# Patient Record
Sex: Female | Born: 1990
Health system: Southern US, Community
[De-identification: ages and names within clinical notes are randomized; demographics above are authoritative.]

## PROBLEM LIST (undated history)

## (undated) DIAGNOSIS — A159 Respiratory tuberculosis unspecified: Secondary | ICD-10-CM

## (undated) DIAGNOSIS — F419 Anxiety disorder, unspecified: Secondary | ICD-10-CM

## (undated) HISTORY — DX: Anxiety disorder, unspecified: F41.9

## (undated) HISTORY — DX: Respiratory tuberculosis unspecified: A15.9

## (undated) HISTORY — PX: OTHER SURGICAL HISTORY: SHX169

---

## 2016-03-06 DIAGNOSIS — H1012 Acute atopic conjunctivitis, left eye: Secondary | ICD-10-CM | POA: Diagnosis not present

## 2016-04-21 DIAGNOSIS — H1032 Unspecified acute conjunctivitis, left eye: Secondary | ICD-10-CM | POA: Diagnosis not present

## 2016-07-14 ENCOUNTER — Encounter (HOSPITAL_BASED_OUTPATIENT_CLINIC_OR_DEPARTMENT_OTHER): Payer: Self-pay | Admitting: *Deleted

## 2016-07-14 ENCOUNTER — Emergency Department (HOSPITAL_BASED_OUTPATIENT_CLINIC_OR_DEPARTMENT_OTHER)
Admission: EM | Admit: 2016-07-14 | Discharge: 2016-07-14 | Disposition: A | Discharge status: Home / Self Care | Payer: No Typology Code available for payment source | Attending: Emergency Medicine | Admitting: Emergency Medicine

## 2016-07-14 ENCOUNTER — Emergency Department (HOSPITAL_BASED_OUTPATIENT_CLINIC_OR_DEPARTMENT_OTHER): Payer: No Typology Code available for payment source

## 2016-07-14 DIAGNOSIS — S39012A Strain of muscle, fascia and tendon of lower back, initial encounter: Secondary | ICD-10-CM | POA: Insufficient documentation

## 2016-07-14 DIAGNOSIS — Z79899 Other long term (current) drug therapy: Secondary | ICD-10-CM | POA: Insufficient documentation

## 2016-07-14 DIAGNOSIS — Y999 Unspecified external cause status: Secondary | ICD-10-CM | POA: Diagnosis not present

## 2016-07-14 DIAGNOSIS — Y939 Activity, unspecified: Secondary | ICD-10-CM | POA: Diagnosis not present

## 2016-07-14 DIAGNOSIS — Y92410 Unspecified street and highway as the place of occurrence of the external cause: Secondary | ICD-10-CM | POA: Diagnosis not present

## 2016-07-14 DIAGNOSIS — S3992XA Unspecified injury of lower back, initial encounter: Secondary | ICD-10-CM | POA: Diagnosis present

## 2016-07-14 MED ORDER — IBUPROFEN 400 MG PO TABS
400.0000 mg | ORAL_TABLET | Freq: Once | ORAL | Status: AC
  Administered 2016-07-14: 400 mg via ORAL
  Filled 2016-07-14: qty 1
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
## 2016-07-14 NOTE — Discharge Instructions (Signed)
It was our pleasure to provide your ER care today - we hope that you feel better.  Take motrin or aleve as need for pain.  Follow up with primary care doctor in 1 week if symptoms fail to improve/resolve.  Return to ER if worse, new symptoms, severe pain, other concern.

## 2016-09-06 DIAGNOSIS — R112 Nausea with vomiting, unspecified: Secondary | ICD-10-CM | POA: Diagnosis not present

## 2016-09-06 DIAGNOSIS — R42 Dizziness and giddiness: Secondary | ICD-10-CM | POA: Diagnosis not present

## 2016-09-16 ENCOUNTER — Encounter: Payer: Self-pay | Admitting: Physician Assistant

## 2016-09-16 ENCOUNTER — Ambulatory Visit (INDEPENDENT_AMBULATORY_CARE_PROVIDER_SITE_OTHER): Payer: 59 | Admitting: Physician Assistant

## 2016-09-16 VITALS — BP 116/80 | HR 92 | Temp 98.5°F | Ht 65.0 in | Wt 139.2 lb

## 2016-09-16 DIAGNOSIS — H6983 Other specified disorders of Eustachian tube, bilateral: Secondary | ICD-10-CM | POA: Diagnosis not present

## 2016-09-16 DIAGNOSIS — H8113 Benign paroxysmal vertigo, bilateral: Secondary | ICD-10-CM | POA: Insufficient documentation

## 2016-09-16 DIAGNOSIS — R42 Dizziness and giddiness: Secondary | ICD-10-CM | POA: Insufficient documentation

## 2016-09-16 MED ORDER — FLUTICASONE PROPIONATE 50 MCG/ACT NA SUSP: 2.0000 | Freq: Every day | NASAL | 11 refills | Status: DC

## 2016-09-16 MED ORDER — LORATADINE 10 MG PO TABS: 10.0000 mg | ORAL_TABLET | Freq: Every day | ORAL | 11 refills | Status: DC

## 2016-09-16 NOTE — Patient Instructions (Signed)

## 2016-09-17 NOTE — Progress Notes (Signed)
BP 116/80   Pulse 92   Temp 98.5 F (36.9 C) (Oral)   Ht 5\' 5"  (1.651 m)   Wt 139 lb 3.2 oz (63.1 kg)   BMI 23.16 kg/m    Subjective:    Patient ID: Bethany Singleton, female    DOB: [birth year], 26 y.o.   MRN: [id number]  HPI: Bethany Rassabella M Singleton is a 26 y.o. female presenting on 09/16/2016 for Dizziness Micah Flesher(Went to ER on 09/06/16 and was dx with vertigo ) and Ears Popping  This is a new patient to our office. Several years ago I saw her [facility]. She been very healthy not having any difficulties. She reports that on July 1 she had an episode where she felt very sick and vomited for about 2 days and very lightheaded. It affected her vision during this time. She had a lot of dizziness and she felt like her head was inside out. She had another episode on 09/07/2016 where she had dizziness and went to the emergency room. She was provided to meclizine. No scans were performed. Throughout this time she has not had any fever or tinnitus. She had another episode this past week on 09/13/2016. She is concerned about the recurrence and chronicity of this.  Relevant past medical, surgical, family and social history reviewed and updated as indicated. Allergies and medications reviewed and updated.  History reviewed. No pertinent past medical history.  Past Surgical History:  Procedure Laterality Date  . lymph node removal from behind R ear      Review of Systems  Constitutional: Negative.  Negative for activity change, fatigue and fever.  HENT: Negative.   Eyes: Negative.   Respiratory: Negative.  Negative for cough.   Cardiovascular: Negative.  Negative for chest pain.  Gastrointestinal: Negative.  Negative for abdominal pain.  Endocrine: Negative.   Genitourinary: Negative.  Negative for dysuria.  Musculoskeletal: Negative.   Skin: Negative.   Neurological: Positive for dizziness and light-headedness. Negative for seizures, weakness, numbness and headaches.    Allergies  as of 09/16/2016      Reactions   Latex       Medication List       Accurate as of 09/16/16 11:59 PM. Always use your most recent med list.          fluticasone 50 MCG/ACT nasal spray Commonly known as:  FLONASE Place 2 sprays into both nostrils daily.   loratadine 10 MG tablet Commonly known as:  CLARITIN Take 1 tablet (10 mg total) by mouth daily.   meclizine 25 MG tablet Commonly known as:  ANTIVERT Take 25 mg by mouth 3 (three) times daily as needed for dizziness.   medroxyPROGESTERone 150 MG/ML injection Commonly known as:  DEPO-PROVERA Inject 150 mg into the muscle every 3 (three) months.          Objective:    BP 116/80   Pulse 92   Temp 98.5 F (36.9 C) (Oral)   Ht 5\' 5"  (1.651 m)   Wt 139 lb 3.2 oz (63.1 kg)   BMI 23.16 kg/m   Allergies  Allergen Reactions  . Latex     Physical Exam  Constitutional: She is oriented to person, place, and time. She appears well-developed and well-nourished.  HENT:  Head: Normocephalic and atraumatic.  Right Ear: Tympanic membrane, external ear and ear canal normal.  Left Ear: Tympanic membrane, external ear and ear canal normal.  Nose: Nose normal. No rhinorrhea.  Mouth/Throat: Oropharynx is clear and  moist and mucous membranes are normal. No oropharyngeal exudate or posterior oropharyngeal erythema.  Eyes: Pupils are equal, round, and reactive to light. Conjunctivae and EOM are normal.  Neck: Normal range of motion. Neck supple.  Cardiovascular: Normal rate, regular rhythm, normal heart sounds and intact distal pulses.   Pulmonary/Chest: Effort normal and breath sounds normal.  Abdominal: Soft. Bowel sounds are normal.  Neurological: She is alert and oriented to person, place, and time. She has normal reflexes. Coordination abnormal. Gait normal.  Positive RHOMBERG  Skin: Skin is warm and dry. No rash noted.  Psychiatric: She has a normal mood and affect. Her behavior is normal. Judgment and thought content  normal.    No results found for this or any previous visit.    Assessment & Plan:   1. Vertigo - meclizine (ANTIVERT) 25 MG tablet; Take 25 mg by mouth 3 (three) times daily as needed for dizziness.  2. Benign positional vertigo, bilateral Neurology referral  3. Dysfunction of both eustachian tubes - loratadine (CLARITIN) 10 MG tablet; Take 1 tablet (10 mg total) by mouth daily.  Dispense: 30 tablet; Refill: 11 - fluticasone (FLONASE) 50 MCG/ACT nasal spray; Place 2 sprays into both nostrils daily.  Dispense: 16 g; Refill: 11   Continue all other maintenance medications as listed above.  Follow up plan: Return if symptoms worsen or fail to improve.  Educational handout given for vertigo  Remus Loffler PA-C [facility] [address]  [place], [place] [address] [phone]   09/17/2016, 9:39 AM

## 2016-09-21 ENCOUNTER — Encounter: Payer: Self-pay | Admitting: Neurology

## 2016-11-21 DIAGNOSIS — W540XXA Bitten by dog, initial encounter: Secondary | ICD-10-CM | POA: Diagnosis not present

## 2016-11-21 DIAGNOSIS — S61211A Laceration without foreign body of left index finger without damage to nail, initial encounter: Secondary | ICD-10-CM | POA: Diagnosis not present

## 2016-11-21 DIAGNOSIS — S61251A Open bite of left index finger without damage to nail, initial encounter: Secondary | ICD-10-CM | POA: Diagnosis not present

## 2016-11-21 DIAGNOSIS — S61259A Open bite of unspecified finger without damage to nail, initial encounter: Secondary | ICD-10-CM | POA: Diagnosis not present

## 2016-12-15 ENCOUNTER — Ambulatory Visit: Payer: Self-pay | Admitting: Neurology

## 2017-01-04 ENCOUNTER — Ambulatory Visit: Payer: Self-pay | Admitting: Neurology

## 2017-02-24 DIAGNOSIS — Z01419 Encounter for gynecological examination (general) (routine) without abnormal findings: Secondary | ICD-10-CM | POA: Diagnosis not present

## 2017-03-12 ENCOUNTER — Encounter: Payer: Self-pay | Admitting: Physician Assistant

## 2017-03-12 ENCOUNTER — Ambulatory Visit (INDEPENDENT_AMBULATORY_CARE_PROVIDER_SITE_OTHER): Payer: 59 | Admitting: Physician Assistant

## 2017-03-12 VITALS — BP 126/76 | HR 94 | Temp 98.3°F | Ht 65.0 in | Wt 155.0 lb

## 2017-03-12 DIAGNOSIS — L0291 Cutaneous abscess, unspecified: Secondary | ICD-10-CM | POA: Diagnosis not present

## 2017-03-12 DIAGNOSIS — K5909 Other constipation: Secondary | ICD-10-CM | POA: Diagnosis not present

## 2017-03-12 MED ORDER — CLINDAMYCIN HCL 300 MG PO CAPS: 300.0000 mg | ORAL_CAPSULE | Freq: Three times a day (TID) | ORAL | 2 refills | Status: DC

## 2017-03-12 NOTE — Patient Instructions (Signed)
Your child needs to take Miralax, a powder that you mix in a clear liquid.   1. Stir the Miralax powder into water, juice, or Gatorade. Your child's Miralax dose is:   8 capfuls of Miralax powder in 32 to 64 ounces of liquid  2. Give your child 4 to 8 ounces to drink every 30 minutes. It will take 4 to 6 hours for your child to finish the medicine. 3. After the medicine is gone, have your child drink more water or juice. This will help with the cleanout.  After the clean out, your child will take a daily (maintenance) medicine for at least 6 months. 1 capful of powder in 8 ounces of liquid every day  Your child may have stomach pain or cramping during the clean out. This might mean your child has to go to the bathroom. Have your child sit on the toilet. Explain that the pain will go away when the stool is gone. You may want to read to your child while you wait. A warm bath may also help.  Your child should have almost clear liquid stools by the end of the next day.   

## 2017-03-15 DIAGNOSIS — K5909 Other constipation: Secondary | ICD-10-CM | POA: Insufficient documentation

## 2017-03-15 NOTE — Progress Notes (Signed)
BP 126/76   Pulse 94   Temp 98.3 F (36.8 C) (Oral)   Ht 5\' 5"  (1.651 m)   Wt 155 lb (70.3 kg)   BMI 25.79 kg/m    Subjective:    Patient ID: Bethany Singleton, female    DOB: [birth year], 27 y.o.   MRN: [id number]  HPI: Bethany Singleton is a 27 y.o. female presenting on 03/12/2017 for Recurrent Skin Infections and Abdominal Cramping  Patient has had long-term constipation.  At this point she is only having watery stools after she eats a fatty meal.  She has not had a cleanout in some time.  She had used Linzess and Amitiza in the past.  She currently is without medication coverage on her insurance.  She has reached a very high deductible before it covers anything.  She also has an abscess in the perirectal area.  She has had these 2 times before.  She denies any problems with hemorrhoids.  Relevant past medical, surgical, family and social history reviewed and updated as indicated. Allergies and medications reviewed and updated.  History reviewed. No pertinent past medical history.  Past Surgical History:  Procedure Laterality Date  . lymph node removal from behind R ear      Review of Systems  Constitutional: Negative.  Negative for activity change, fatigue and fever.  HENT: Negative.   Eyes: Negative.   Respiratory: Negative.  Negative for cough.   Cardiovascular: Negative.  Negative for chest pain.  Gastrointestinal: Positive for abdominal distention, abdominal pain, constipation and rectal pain. Negative for blood in stool.  Endocrine: Negative.   Genitourinary: Negative.  Negative for dysuria.  Musculoskeletal: Negative.   Skin: Positive for color change.  Neurological: Negative.     Allergies as of 03/12/2017      Reactions   Latex       Medication List        Accurate as of 03/12/17 11:59 PM. Always use your most recent med list.          clindamycin 300 MG capsule Commonly known as:  CLEOCIN Take 1 capsule (300 mg total) by mouth 3 (three) times daily.          Objective:    BP 126/76   Pulse 94   Temp 98.3 F (36.8 C) (Oral)   Ht 5\' 5"  (1.651 m)   Wt 155 lb (70.3 kg)   BMI 25.79 kg/m   Allergies  Allergen Reactions  . Latex     Physical Exam  Constitutional: She is oriented to person, place, and time. She appears well-developed and well-nourished.  HENT:  Head: Normocephalic and atraumatic.  Right Ear: Tympanic membrane, external ear and ear canal normal.  Left Ear: Tympanic membrane, external ear and ear canal normal.  Nose: Nose normal. No rhinorrhea.  Mouth/Throat: Oropharynx is clear and moist and mucous membranes are normal. No oropharyngeal exudate or posterior oropharyngeal erythema.  Eyes: Conjunctivae and EOM are normal. Pupils are equal, round, and reactive to light.  Neck: Normal range of motion. Neck supple.  Cardiovascular: Normal rate, regular rhythm, normal heart sounds and intact distal pulses.  Pulmonary/Chest: Effort normal and breath sounds normal.  Abdominal: Soft. Bowel sounds are normal. She exhibits no shifting dullness. There is tenderness in the left upper quadrant and left lower quadrant. There is no rebound and no CVA tenderness. No hernia.    Neurological: She is alert and oriented to person, place, and time. She has normal reflexes.  Skin: Skin  is warm and dry. No rash noted.  Abscess in perirectal area  Psychiatric: She has a normal mood and affect. Her behavior is normal. Judgment and thought content normal.    No results found for this or any previous visit.    Assessment & Plan:   1. Chronic constipation with overflow Start bowel cleanout with MiraLAX No insurance coverage for medications.  Had tried Linzess and Amitiza in the past.  2. Abscess - clindamycin (CLEOCIN) 300 MG capsule; Take 1 capsule (300 mg total) by mouth 3 (three) times daily.  Dispense: 30 capsule; Refill: 2    Current Outpatient Medications:  .  clindamycin (CLEOCIN) 300 MG capsule, Take 1 capsule (300 mg  total) by mouth 3 (three) times daily., Disp: 30 capsule, Rfl: 2 Continue all other maintenance medications as listed above.  Follow up plan: Return in about 4 weeks (around 04/09/2017) for recheck.  Educational handout given for bowel cleanout  Remus Loffler PA-C [facility] [address]  [place], [place] [address] [phone]   03/15/2017, 9:26 AM

## 2017-05-17 ENCOUNTER — Telehealth: Payer: Self-pay | Admitting: Physician Assistant

## 2017-05-18 NOTE — Telephone Encounter (Signed)
lmtcb jkp 3/19

## 2017-05-18 NOTE — Telephone Encounter (Signed)
Patient was seen by Prudy FeelerAngel Jones on 03/12/2017 and was supposed to follow up in one month for constipation and did not.  Appointment made this Friday, 05/21/17, with Prudy FeelerAngel Jones to discuss.

## 2017-05-21 ENCOUNTER — Ambulatory Visit: Payer: 59 | Admitting: Physician Assistant

## 2017-05-26 ENCOUNTER — Ambulatory Visit: Payer: 59 | Admitting: Physician Assistant

## 2017-07-05 ENCOUNTER — Encounter: Payer: Self-pay | Admitting: Physician Assistant

## 2017-07-06 ENCOUNTER — Other Ambulatory Visit: Payer: Self-pay | Admitting: Physician Assistant

## 2017-07-06 MED ORDER — CITALOPRAM HYDROBROMIDE 20 MG PO TABS: 20.0000 mg | ORAL_TABLET | Freq: Every day | ORAL | 1 refills | Status: DC

## 2017-07-21 ENCOUNTER — Other Ambulatory Visit: Payer: Self-pay | Admitting: Physician Assistant

## 2017-07-22 MED ORDER — CITALOPRAM HYDROBROMIDE 20 MG PO TABS
20.0000 mg | ORAL_TABLET | Freq: Every day | ORAL | 1 refills | Status: DC
Start: 2017-07-22 — End: 2018-10-28

## 2017-07-22 NOTE — Addendum Note (Signed)
Addended by: Julious Payer D on: 07/22/2017 10:52 AM   Modules accepted: Orders

## 2018-05-03 ENCOUNTER — Encounter: Payer: Self-pay | Admitting: Physician Assistant

## 2018-10-28 ENCOUNTER — Other Ambulatory Visit: Payer: Self-pay

## 2018-10-28 ENCOUNTER — Encounter: Payer: Self-pay | Admitting: Nurse Practitioner

## 2018-10-28 ENCOUNTER — Ambulatory Visit (INDEPENDENT_AMBULATORY_CARE_PROVIDER_SITE_OTHER): Payer: 59 | Admitting: Nurse Practitioner

## 2018-10-28 ENCOUNTER — Telehealth: Payer: Self-pay | Admitting: Physician Assistant

## 2018-10-28 VITALS — BP 117/77 | HR 96 | Temp 99.1°F | Ht 65.0 in | Wt 144.0 lb

## 2018-10-28 DIAGNOSIS — F411 Generalized anxiety disorder: Secondary | ICD-10-CM

## 2018-10-28 DIAGNOSIS — K219 Gastro-esophageal reflux disease without esophagitis: Secondary | ICD-10-CM

## 2018-10-28 MED ORDER — PANTOPRAZOLE SODIUM 40 MG PO TBEC: 40.0000 mg | DELAYED_RELEASE_TABLET | Freq: Every day | ORAL | 3 refills | Status: DC

## 2018-10-28 MED ORDER — CITALOPRAM HYDROBROMIDE 20 MG PO TABS: 20.0000 mg | ORAL_TABLET | Freq: Every day | ORAL | 1 refills | Status: DC

## 2018-10-28 NOTE — Progress Notes (Signed)
Subjective:    Patient ID: Bethany Singleton, female    DOB: [birth year], 28 y.o.   MRN: [id number]   Chief Complaint: Chest burns after eating (Prilosec is not helping) and Anxiety   HPI Patient comes in today with 2 complaints - chest pain- says it is a burning feeling in chest. Started 2 weeks ago after drinking to much tequila. She says that after she eats it is worse. Pain increases when laying down in bed. She has been taking tums and prilosec and is no better. - anxiety- she has been anxious for several months.she was rx celexa and she never filled meds because it said that was for depression and she is not depressed. She is very anxious, working full time, going to school and planning a wedding. GAD 7 : Generalized Anxiety Score 10/28/2018  Nervous, Anxious, on Edge 3  Control/stop worrying 3  Worry too much - different things 2  Trouble relaxing 2  Restless 1  Easily annoyed or irritable 2  Afraid - awful might happen 0  Total GAD 7 Score 13  Anxiety Difficulty Somewhat difficult      Review of Systems  Constitutional: Negative for activity change and appetite change.  HENT: Negative.   Eyes: Negative for pain.  Respiratory: Negative for shortness of breath.   Cardiovascular: Negative for chest pain, palpitations and leg swelling.  Gastrointestinal: Negative for abdominal pain.  Endocrine: Negative for polydipsia.  Genitourinary: Negative.   Skin: Negative for rash.  Neurological: Negative for dizziness, weakness and headaches.  Hematological: Does not bruise/bleed easily.  Psychiatric/Behavioral: Negative.   All other systems reviewed and are negative.      Objective:   Physical Exam Vitals signs and nursing note reviewed.  Constitutional:      General: She is not in acute distress.    Appearance: Normal appearance. She is well-developed.  HENT:     Head: Normocephalic.     Nose: Nose normal.  Eyes:     Pupils: Pupils are equal, round, and reactive to  light.  Neck:     Musculoskeletal: Normal range of motion and neck supple.     Vascular: No carotid bruit or JVD.  Cardiovascular:     Rate and Rhythm: Normal rate and regular rhythm.     Heart sounds: Normal heart sounds.  Pulmonary:     Effort: Pulmonary effort is normal. No respiratory distress.     Breath sounds: Normal breath sounds. No wheezing or rales.  Chest:     Chest wall: No tenderness.  Abdominal:     General: Bowel sounds are normal. There is no distension or abdominal bruit.     Palpations: Abdomen is soft. There is no hepatomegaly, splenomegaly, mass or pulsatile mass.     Tenderness: There is no abdominal tenderness.  Musculoskeletal: Normal range of motion.  Lymphadenopathy:     Cervical: No cervical adenopathy.  Skin:    General: Skin is warm and dry.  Neurological:     Mental Status: She is alert and oriented to person, place, and time.     Deep Tendon Reflexes: Reflexes are normal and symmetric.  Psychiatric:        Behavior: Behavior normal.        Thought Content: Thought content normal.        Judgment: Judgment normal.    BP 117/77   Pulse 96   Temp 99.1 F (37.3 C) (Oral)   Ht 5\' 5"  (1.651 m)   Wt 144  lb (65.3 kg)   BMI 23.96 kg/m        Assessment & Plan:  ITZEL MCKIBBIN comes in today with chief complaint of Chest burns after eating (Prilosec is not helping) and Anxiety   Diagnosis and orders addressed:  1. Gastroesophageal reflux disease without esophagitis Avoid spicy and fatty foods Bland diet for 2 weeks - pantoprazole (PROTONIX) 40 MG tablet; Take 1 tablet (40 mg total) by mouth daily.  Dispense: 30 tablet; Refill: 3  2. GAD (generalized anxiety disorder) Stress management - citalopram (CELEXA) 20 MG tablet; Take 1 tablet (20 mg total) by mouth daily.  Dispense: 90 tablet; Refill: 1   Follow up plan: prn   Mary-Margaret Hassell Done, FNP

## 2018-10-28 NOTE — Patient Instructions (Signed)

## 2018-10-28 NOTE — Telephone Encounter (Signed)
Pt apt made

## 2018-11-10 ENCOUNTER — Other Ambulatory Visit: Payer: Self-pay | Admitting: Physician Assistant

## 2018-11-10 DIAGNOSIS — K219 Gastro-esophageal reflux disease without esophagitis: Secondary | ICD-10-CM

## 2018-11-14 ENCOUNTER — Encounter: Payer: Self-pay | Admitting: Gastroenterology

## 2018-11-18 ENCOUNTER — Encounter: Payer: Self-pay | Admitting: Physician Assistant

## 2018-11-18 ENCOUNTER — Ambulatory Visit (INDEPENDENT_AMBULATORY_CARE_PROVIDER_SITE_OTHER): Payer: 59 | Admitting: Family Medicine

## 2018-11-18 ENCOUNTER — Encounter: Payer: Self-pay | Admitting: Family Medicine

## 2018-11-18 DIAGNOSIS — Z8742 Personal history of other diseases of the female genital tract: Secondary | ICD-10-CM | POA: Diagnosis not present

## 2018-11-18 DIAGNOSIS — L03211 Cellulitis of face: Secondary | ICD-10-CM | POA: Diagnosis not present

## 2018-11-18 MED ORDER — AMOXICILLIN-POT CLAVULANATE 875-125 MG PO TABS: 1.0000 | ORAL_TABLET | Freq: Two times a day (BID) | ORAL | 0 refills | Status: AC

## 2018-11-18 MED ORDER — FLUCONAZOLE 150 MG PO TABS: ORAL_TABLET | ORAL | 0 refills | Status: DC

## 2018-11-18 MED ORDER — SULFAMETHOXAZOLE-TRIMETHOPRIM 800-160 MG PO TABS: 1.0000 | ORAL_TABLET | Freq: Two times a day (BID) | ORAL | 0 refills | Status: AC

## 2018-11-18 NOTE — Progress Notes (Signed)
Virtual Visit via telephone Note Due to COVID-19 pandemic this visit was conducted virtually. This visit type was conducted due to national recommendations for restrictions regarding the COVID-19 Pandemic (e.g. social distancing, sheltering in place) in an effort to limit this patient's exposure and mitigate transmission in our community. All issues noted in this document were discussed and addressed.  A physical exam was not performed with this format.   I connected with Bethany Singleton on 11/18/18 at 1110 by telephone and verified that I am speaking with the correct person using two identifiers. Bethany Singleton is currently located at home and family is currently with them during visit. The provider, Monia Pouch, FNP is located in their office at time of visit.  I discussed the limitations, risks, security and privacy concerns of performing an evaluation and management service by telephone and the availability of in person appointments. I also discussed with the patient that there may be a patient responsible charge related to this service. The patient expressed understanding and agreed to proceed.  Subjective:  Patient ID: Bethany Singleton, female    DOB: 12/20/1990, 28 y.o.   MRN: [id number]  Chief Complaint:  Abscess   HPI: Bethany Singleton is a 28 y.o. female presenting on 11/18/2018 for Abscess   Pt states she has swelling and redness to her right lower jaw. Pt states she has a bump there and tried to squeeze it a few days ago. States she has since developed the redness and swelling. States she was not able to get much out of the bump. States she did have a temperature of 99.4 yesterday, none today. No eye involvement or visual changes. No reported dental pain or caries. No weakness, confusion, or fatigue. No trouble swallowing, throat swelling, or shortness of breath.   Abscess This is a new problem. The current episode started in the past 7 days. Associated symptoms include a fever.  Pertinent negatives include no abdominal pain, anorexia, arthralgias, change in bowel habit, chest pain, chills, congestion, coughing, diaphoresis, fatigue, headaches, joint swelling, myalgias, nausea, neck pain, numbness, rash, sore throat, swollen glands, urinary symptoms, vertigo, visual change, vomiting or weakness.     Relevant past medical, surgical, family, and social history reviewed and updated as indicated.  Allergies and medications reviewed and updated.   History reviewed. No pertinent past medical history.  Past Surgical History:  Procedure Laterality Date  . lymph node removal from behind R ear      Social History   Socioeconomic History  . Marital status: Single    Spouse name: Not on file  . Number of children: Not on file  . Years of education: Not on file  . Highest education level: Not on file  Occupational History  . Not on file  Social Needs  . Financial resource strain: Not on file  . Food insecurity    Worry: Not on file    Inability: Not on file  . Transportation needs    Medical: Not on file    Non-medical: Not on file  Tobacco Use  . Smoking status: Never Smoker  . Smokeless tobacco: Never Used  Substance and Sexual Activity  . Alcohol use: Yes    Comment: weekends  . Drug use: No  . Sexual activity: Yes    Birth control/protection: Injection  Lifestyle  . Physical activity    Days per week: Not on file    Minutes per session: Not on file  . Stress: Not on file  Relationships  . Social Musician on phone: Not on file    Gets together: Not on file    Attends religious service: Not on file    Active member of club or organization: Not on file    Attends meetings of clubs or organizations: Not on file    Relationship status: Not on file  . Intimate partner violence    Fear of current or ex partner: Not on file    Emotionally abused: Not on file    Physically abused: Not on file    Forced sexual activity: Not on file  Other  Topics Concern  . Not on file  Social History Narrative  . Not on file    Outpatient Encounter Medications as of 11/18/2018  Medication Sig  . amoxicillin-clavulanate (AUGMENTIN) 875-125 MG tablet Take 1 tablet by mouth 2 (two) times daily for 7 days.  . citalopram (CELEXA) 20 MG tablet Take 1 tablet (20 mg total) by mouth daily.  . fluconazole (DIFLUCAN) 150 MG tablet 1 po q week x 4 weeks  . pantoprazole (PROTONIX) 40 MG tablet Take 1 tablet (40 mg total) by mouth daily.  Marland Kitchen sulfamethoxazole-trimethoprim (BACTRIM DS) 800-160 MG tablet Take 1 tablet by mouth 2 (two) times daily for 7 days.   No facility-administered encounter medications on file as of 11/18/2018.     Allergies  Allergen Reactions  . Latex     Review of Systems  Constitutional: Positive for fever. Negative for activity change, appetite change, chills, diaphoresis, fatigue and unexpected weight change.  HENT: Negative.  Negative for congestion, sore throat, trouble swallowing and voice change.   Eyes: Negative.  Negative for photophobia, pain, discharge, redness, itching and visual disturbance.  Respiratory: Negative for cough, choking, chest tightness, shortness of breath and stridor.   Cardiovascular: Negative for chest pain, palpitations and leg swelling.  Gastrointestinal: Negative for abdominal pain, anorexia, blood in stool, change in bowel habit, constipation, diarrhea, nausea and vomiting.  Endocrine: Negative.   Genitourinary: Negative for dysuria, frequency and urgency.  Musculoskeletal: Negative for arthralgias, joint swelling, myalgias and neck pain.  Skin: Positive for color change and wound. Negative for pallor and rash.  Allergic/Immunologic: Negative.   Neurological: Negative for dizziness, vertigo, weakness, numbness and headaches.  Hematological: Negative.   Psychiatric/Behavioral: Negative for confusion, hallucinations, sleep disturbance and suicidal ideas.  All other systems reviewed and are  negative.        Observations/Objective: No vital signs or physical exam, this was a telephone or virtual health encounter. Did review below images pt provided via MyChart.  Pt alert and oriented, answers all questions appropriately, and able to speak in full sentences.        Assessment and Plan: Rollene was seen today for abscess.  Diagnoses and all orders for this visit:  Cellulitis of face Cellulitis of face. Swelling and redness occurred after trying to squeeze a pimple. Slight low grade fever yesterday, none today. Will cover with below. Pt aware of symptoms that require emergent evaluation. Report any new, worsening, or persistent symptoms.  -     sulfamethoxazole-trimethoprim (BACTRIM DS) 800-160 MG tablet; Take 1 tablet by mouth 2 (two) times daily for 7 days. -     amoxicillin-clavulanate (AUGMENTIN) 875-125 MG tablet; Take 1 tablet by mouth 2 (two) times daily for 7 days.  History of vaginitis -     fluconazole (DIFLUCAN) 150 MG tablet; 1 po q week x 4 weeks     Follow Up  Instructions: Return in about 1 week (around 11/25/2018), or if symptoms worsen or fail to improve, for facial cellulitis .    I discussed the assessment and treatment plan with the patient. The patient was provided an opportunity to ask questions and all were answered. The patient agreed with the plan and demonstrated an understanding of the instructions.   The patient was advised to call back or seek an in-person evaluation if the symptoms worsen or if the condition fails to improve as anticipated.  The above assessment and management plan was discussed with the patient. The patient verbalized understanding of and has agreed to the management plan. Patient is aware to call the clinic if they develop any new symptoms or if symptoms persist or worsen. Patient is aware when to return to the clinic for a follow-up visit. Patient educated on when it is appropriate to go to the emergency department.     I provided 15 minutes of non-face-to-face time during this encounter. The call started at 1110. The call ended at [address]. The other time was used for coordination of care.    Kari BaarsMichelle Tenisha Fleece, FNP-C [facility] [address] [place], [place] [id number] [phone] 11/18/18

## 2018-12-01 ENCOUNTER — Ambulatory Visit: Payer: 59 | Admitting: Nurse Practitioner

## 2018-12-06 NOTE — Progress Notes (Addendum)
REVIEWED-NO ADDITIONAL RECOMMENDATIONS.  Referring Provider: Remus Loffler, PA-C Primary Care Physician:  Remus Loffler, PA-C Primary Gastroenterologist:  Dr. Darrick Penna  Chief Complaint  Patient presents with  . Gastroesophageal Reflux    HPI:   Bethany Singleton is a 28 y.o. female presenting today at the request of Remus Loffler, PA-C for GERD.   Reviewed recent PCP note from 10/28/2018.  Patient complained of burning in her chest x2 weeks after drinking too much tequila.  Worse after meals and with lying down.  Taking Tums and Prilosec without significant improvement.  Advised to follow bland diet for 2 weeks and start Protonix 40 mg daily.  Patient messages on 11/09/2018 with patient stating Protonix was not providing significant improvement.  Subsequently patient was referred to GI.   Today she states she has burning in her chest that started around the end of August. Was on Keto diet prior and after stopping the diet, she developed the burning in her chest.  Eating fried, fatty, greasy foods. Spaghetti, tacos, steak, fries.  Burning is worse with spicy foods. Drinks coffee daily. No soda. No large meals at dinner.   Taking Protonix 40 mg daily without significant improvement. Tried additional antiacid that doesn't help. Tums do not help. Chewing gum will help. Doesn't matter if she eats or doesn't. Burning is present all day. States she has a lot of anxiety. Was started on Celexa to see if this helped with burning. Symptoms are worse when waking in the morning. Taking Protonix after breakfast.    No dysphagia. No acid reflux. Occasional upper abdominal cramping. Feels like a gas cramp. States she is constipated all the time. Feels cramping is associated with constipation. No nausea or vomiting.   Reports she has long history of chronic constipation. BMs about once a week. Stools are small. Comes out in pieces. Straining at times. Mostly hard stools. Tried MiraLAX in the past which did not  help.  Linzess was too expensive. Takes probiotic.  Will have bright red blood per rectum with associated constipation. Occurs once a month to once every other month. Present for a few years. Has hemorrhoids that prolapse out with BMs. Not always bleeding. Will have burning at times. No sharp pain.   Rare ibuprofen use.   Past Medical History:  Diagnosis Date  . Anxiety     Past Surgical History:  Procedure Laterality Date  . lymph node removal from behind R ear      Current Outpatient Medications  Medication Sig Dispense Refill  . citalopram (CELEXA) 20 MG tablet Take 1 tablet (20 mg total) by mouth daily. 90 tablet 1  . lansoprazole (PREVACID) 30 MG capsule Take 1 capsule (30 mg total) by mouth daily at 12 noon. 90 capsule 3  . lubiprostone (AMITIZA) 8 MCG capsule Take 1 capsule (8 mcg total) by mouth 2 (two) times daily with a meal. 60 capsule 3   No current facility-administered medications for this visit.     Allergies as of 12/07/2018 - Review Complete 12/07/2018  Allergen Reaction Noted  . Latex      Family History  Problem Relation Age of Onset  . Colon cancer Neg Hx   . Colon polyps Neg Hx   . Inflammatory bowel disease Neg Hx     Social History   Socioeconomic History  . Marital status: Single    Spouse name: Not on file  . Number of children: Not on file  . Years of education: Not  on file  . Highest education level: Not on file  Occupational History  . Not on file  Social Needs  . Financial resource strain: Not on file  . Food insecurity    Worry: Not on file    Inability: Not on file  . Transportation needs    Medical: Not on file    Non-medical: Not on file  Tobacco Use  . Smoking status: Never Smoker  . Smokeless tobacco: Never Used  Substance and Sexual Activity  . Alcohol use: Yes    Comment: weekends- glass or two of wine  . Drug use: No  . Sexual activity: Yes    Birth control/protection: Injection  Lifestyle  . Physical activity     Days per week: Not on file    Minutes per session: Not on file  . Stress: Not on file  Relationships  . Social Herbalist on phone: Not on file    Gets together: Not on file    Attends religious service: Not on file    Active member of club or organization: Not on file    Attends meetings of clubs or organizations: Not on file    Relationship status: Not on file  . Intimate partner violence    Fear of current or ex partner: Not on file    Emotionally abused: Not on file    Physically abused: Not on file    Forced sexual activity: Not on file  Other Topics Concern  . Not on file  Social History Narrative  . Not on file    Review of Systems: Gen: Denies any fever, chills, fatigue, lightheadedness, dizziness, unintentional weight loss CV: Denies chest pain, heart palpitations Resp: Denies shortness of breath or cough.  GI: See HPI GU : Denies urinary burning, urinary frequency, urinary hesitancy MS: Denies joint pain, muscle weakness Derm: Denies rash Psych: Admits to anxiety.  Heme: Denies bruising.  Physical Exam: BP 115/81   Pulse 89   Temp (!) 96.8 F (36 C) (Temporal)   Ht 5\' 4"  (1.626 m)   Wt 143 lb 3.2 oz (65 kg)   LMP 11/14/2018 (Approximate)   BMI 24.58 kg/m  General:   Alert and oriented. Pleasant and cooperative. Well-nourished and well-developed.  Head:  Normocephalic and atraumatic. Eyes:  Without icterus, sclera clear and conjunctiva pink.  Ears:  Normal auditory acuity. Nose:  No deformity, discharge,  or lesions. Lungs:  Clear to auscultation bilaterally. No wheezes, rales, or rhonchi. No distress.  Heart:  S1, S2 present without murmurs appreciated.  Abdomen:  +BS, soft, non-tender and non-distended. No HSM noted. No guarding or rebound. No masses appreciated.  Rectal:  Deferred  Msk:  Symmetrical without gross deformities. Normal posture. Extremities:  Without edema. Neurologic:  Alert and  oriented x4;  grossly normal neurologically.  Skin:  Intact without significant lesions or rashes. Psych: Normal mood and affect.

## 2018-12-07 ENCOUNTER — Ambulatory Visit (INDEPENDENT_AMBULATORY_CARE_PROVIDER_SITE_OTHER): Payer: 59 | Admitting: Gastroenterology

## 2018-12-07 ENCOUNTER — Encounter: Payer: Self-pay | Admitting: Gastroenterology

## 2018-12-07 ENCOUNTER — Other Ambulatory Visit: Payer: Self-pay

## 2018-12-07 ENCOUNTER — Encounter: Payer: Self-pay | Admitting: *Deleted

## 2018-12-07 DIAGNOSIS — K625 Hemorrhage of anus and rectum: Secondary | ICD-10-CM | POA: Insufficient documentation

## 2018-12-07 DIAGNOSIS — K5909 Other constipation: Secondary | ICD-10-CM | POA: Insufficient documentation

## 2018-12-07 DIAGNOSIS — K649 Unspecified hemorrhoids: Secondary | ICD-10-CM | POA: Insufficient documentation

## 2018-12-07 DIAGNOSIS — R12 Heartburn: Secondary | ICD-10-CM | POA: Diagnosis not present

## 2018-12-07 MED ORDER — LANSOPRAZOLE 30 MG PO CPDR: 30.0000 mg | DELAYED_RELEASE_CAPSULE | Freq: Every day | ORAL | 3 refills | Status: DC

## 2018-12-07 MED ORDER — LUBIPROSTONE 8 MCG PO CAPS: 8.0000 ug | ORAL_CAPSULE | Freq: Two times a day (BID) | ORAL | 3 refills | Status: DC

## 2018-12-07 NOTE — Patient Instructions (Addendum)
We will get you scheduled for colonoscopy with possible hemorrhoid banding in the near future with Dr. Darrick Penna.   I will call [employer] and have them compound a cream for you to use for you hemorrhoids. They will call you when it is ready. You can use 3-4 times daily x 7 days then as needed for flares of hemorrhoids.   Please add metamucil or benefiber daily.   I will send in Amitiza 8 mcg for you to take twice daily with meals for constipation.   Please stop Protonix and start Prevacid 30 mg daily. Take this 30 minutes before breakfast.   Please follow strict GERD diet. Avoid fried, fatty, spicy, and citrus foods. Avoid caffeine and carbonated beverages. See handout below.   You should several small meals a day rather than 3 large meals. Ensure your largest meal is not in the evening. Do not eat within 3 hours of laying down. Prop the head of your bed up on wood or bricks to create an incline.   We will see you back in 4 weeks. Call if questions or concerns prior.   Ermalinda Memos, PA-C [facility]   Food Choices for Gastroesophageal Reflux Disease, Adult When you have gastroesophageal reflux disease (GERD), the foods you eat and your eating habits are very important. Choosing the right foods can help ease your discomfort. Think about working with a nutrition specialist (dietitian) to help you make good choices. What are tips for following this plan?  Meals  Choose healthy foods that are low in fat, such as fruits, vegetables, whole grains, low-fat dairy products, and lean meat, fish, and poultry.  Eat small meals often instead of 3 large meals a day. Eat your meals slowly, and in a place where you are relaxed. Avoid bending over or lying down until 2-3 hours after eating.  Avoid eating meals 2-3 hours before bed.  Avoid drinking a lot of liquid with meals.  Cook foods using methods other than frying. Bake, grill, or broil food instead.  Avoid or  limit: ? Chocolate. ? Peppermint or spearmint. ? Alcohol. ? Pepper. ? Black and decaffeinated coffee. ? Black and decaffeinated tea. ? Bubbly (carbonated) soft drinks. ? Caffeinated energy drinks and soft drinks.  Limit high-fat foods such as: ? Fatty meat or fried foods. ? Whole milk, cream, butter, or ice cream. ? Nuts and nut butters. ? Pastries, donuts, and sweets made with butter or shortening.  Avoid foods that cause symptoms. These foods may be different for everyone. Common foods that cause symptoms include: ? Tomatoes. ? Oranges, lemons, and limes. ? Peppers. ? Spicy food. ? Onions and garlic. ? Vinegar. Lifestyle  Maintain a healthy weight. Ask your doctor what weight is healthy for you. If you need to lose weight, work with your doctor to do so safely.  Exercise for at least 30 minutes for 5 or more days each week, or as told by your doctor.  Wear loose-fitting clothes.  Sleep with the head of your bed higher than your feet. Use a wedge under the mattress or blocks under the bed frame to raise the head of the bed.  Do not use any products that contain nicotine or tobacco, such as cigarettes, e-cigarettes, and chewing tobacco. If you need help quitting, ask your health care provider.  Try to reduce your stress by using methods such as yoga or meditation. If you need help reducing stress, ask your health care provider.  If you are overweight, reduce  your weight to an amount that is healthy for you. Ask your health care provider for guidance about a safe weight loss goal.   General instructions  Pay attention to any changes in your symptoms.  Take over-the-counter and prescription medicines only as told by your health care provider. Do not take aspirin, ibuprofen, or other NSAIDs unless your health care provider told you to do so.  Wear loose-fitting clothing. Do not wear anything tight around your waist that causes pressure on your abdomen.  Raise (elevate)  the head of your bed about 6 inches (15 cm).   Summary  When you have gastroesophageal reflux disease (GERD), food and lifestyle choices are very important in easing your symptoms.  Eat small meals often instead of 3 large meals a day. Eat your meals slowly, and in a place where you are relaxed.  Limit high-fat foods such as fatty meat or fried foods.  Avoid bending over or lying down until 2-3 hours after eating.  Avoid peppermint and spearmint, caffeine, alcohol, and chocolate. This information is not intended to replace advice given to you by your health care provider. Make sure you discuss any questions you have with your health care provider. Document Released: 08/18/2011 Document Revised: 06/09/2018 Document Reviewed: 03/24/2016 Elsevier Patient Education  2020 [employer].

## 2018-12-08 ENCOUNTER — Telehealth: Payer: Self-pay | Admitting: *Deleted

## 2018-12-08 ENCOUNTER — Other Ambulatory Visit: Payer: Self-pay | Admitting: *Deleted

## 2018-12-08 ENCOUNTER — Encounter: Payer: Self-pay | Admitting: Gastroenterology

## 2018-12-08 ENCOUNTER — Other Ambulatory Visit: Payer: Self-pay | Admitting: Gastroenterology

## 2018-12-08 DIAGNOSIS — R12 Heartburn: Secondary | ICD-10-CM

## 2018-12-08 DIAGNOSIS — K625 Hemorrhage of anus and rectum: Secondary | ICD-10-CM

## 2018-12-08 NOTE — Telephone Encounter (Signed)
Patient called back and is aware of appt details 

## 2018-12-08 NOTE — Telephone Encounter (Signed)
LMOVM for pt She is scheduled for pre-op phone call on 10/9 at 2:15pm, needs to go for COVID-19 testing Mon 10/12 at 9:00am.

## 2018-12-08 NOTE — Telephone Encounter (Signed)
PA approved via [facility] website. Auth# [id number] dates 12/13/2018-03/13/2019

## 2018-12-09 ENCOUNTER — Encounter (HOSPITAL_COMMUNITY)
Admission: RE | Admit: 2018-12-09 | Discharge: 2018-12-09 | Disposition: A | Discharge status: Home / Self Care | Payer: 59 | Source: Ambulatory Visit | Attending: Gastroenterology | Admitting: Gastroenterology

## 2018-12-09 ENCOUNTER — Other Ambulatory Visit: Payer: Self-pay

## 2018-12-09 NOTE — Telephone Encounter (Signed)
Pharmacy requesting change to omeprazole. Otherwise will need PA. Will defer to Cyril Mourning (who recently saw the patient and changed her from Protonix to Prevacid)  FYI- DS in case PA needed.

## 2018-12-10 NOTE — Assessment & Plan Note (Addendum)
Bright red blood per rectum present for a few years. Occurs about once a month to once every other month.  Patient reports a long history of constipation as well as known hemorrhoids that prolapse at times.  Rectal burning at times.  No sharp pain.  No lower abdominal pain, unintentional weight loss, or melena. She does have new onset of uncontrolled heartburn addressed above.  No family history of colon cancer.   Although I suspect rectal bleeding is likely secondary to constipation and known hemorrhoids, patient needs further evaluation with colonoscopy to exclude colon polyps or malignancy.  Discussed possible hemorrhoid banding at the time of colonoscopy if appropriate.  Patient agrees and desires hemorrhoid banding.  Proceed with TCS +/-hemorrhoid banding with Dr. Oneida Alar with propofol in the near future. The risks, benefits, and alternatives have been discussed in detail with patient. They have stated understanding and desire to proceed.  [place] [facility] cream 3-4 times daily x10 days and as needed for hemorrhoids. Amitiza 8 mcg twice daily for constipation.  Add Benefiber or Metamucil daily.

## 2018-12-10 NOTE — Assessment & Plan Note (Addendum)
Long history of constipation.  Bowel movements about once a week.  Stools are hard and require straining at times.  Has tried MiraLAX in the past without success.  Linzess was too expensive.  She does admit to bright red blood per rectum about once a month to once every other month as addressed below.  Also with known hemorrhoids that prolapse at times with associated rectal burning.  No unintentional weight loss or rectal pain.  No family history of colon cancer.  [place] [facility] cream 3-4 times daily x10 days and as needed. Amitiza 8 mcg twice daily with meals Add Benefiber or Metamucil daily TCS +/-hemorrhoid banding with Dr. Oneida Alar with propofol in the near future for rectal bleeding and hemorrhoids.

## 2018-12-10 NOTE — Assessment & Plan Note (Addendum)
Reports history of hemorrhoids or prolapse at times.  Has long history of constipation, bright red blood per rectum once a month to once every other month, and rectal burning at times.  No rectal pain.  [place] [facility] cream 3-4 times daily x10 days and as needed for hemorrhoids. Amitiza 8 mcg twice daily with meals for constipation. Benefiber or Metamucil daily. TCS +/-hemorrhoid banding with Dr. Oneida Alar with propofol for rectal bleeding and hemorrhoids. The risks, benefits, and alternatives have been discussed in detail with patient. They have stated understanding and desire to proceed.

## 2018-12-10 NOTE — Assessment & Plan Note (Addendum)
28 year old female with new onset of heartburn around the end of August 2020 after stopping keto diet.  Currently consuming fried, fatty, greasy, and tomato based foods.  Drinking coffee daily.  She has been on Protonix 40 mg daily since 10/28/2018 without improvement.  Taking Protonix after breakfast.  Has tried other antacids without relief.  Chewing gum provide some relief.  Currently with burning in her chest all day.  States symptoms are worse in the morning.  Denies acid reflux, nausea, vomiting, dysphasia, melena or unintentional weight loss.  Admits to occasional upper abdominal cramping that feels like a gas cramp.  She feels this is more related to her chronic constipation.  Admits to bright red blood per rectum as described below.  Rare ibuprofen use. 1-2 glasses of wine on the weekend.  Abdominal exam is benign.  Suspect heartburn symptoms are related to dietary habits.  We will start with changing PPI and advising strict adherence to GERD diet and lifestyle.  Stop Protonix 40 mg and start Prevacid 30 mg 30 minutes before breakfast. Avoid fried, fatty, spicy, and citrus foods. Avoid caffeine and carbonated beverages. Several small meals a day rather than 3 large meals. Ensure largest meal is not in the evening. Do not eat within 3 hours of laying down. Prop head of bed up on wood or bricks to create an incline.  GERD handout provided. Follow-up in 4 weeks. Call if questions or concerns prior.

## 2018-12-12 ENCOUNTER — Other Ambulatory Visit: Payer: Self-pay

## 2018-12-12 ENCOUNTER — Other Ambulatory Visit (HOSPITAL_COMMUNITY)
Admission: RE | Admit: 2018-12-12 | Discharge: 2018-12-12 | Disposition: A | Discharge status: Home / Self Care | Payer: 59 | Source: Ambulatory Visit | Attending: Gastroenterology | Admitting: Gastroenterology

## 2018-12-12 DIAGNOSIS — Z01812 Encounter for preprocedural laboratory examination: Secondary | ICD-10-CM | POA: Diagnosis not present

## 2018-12-12 DIAGNOSIS — Z20828 Contact with and (suspected) exposure to other viral communicable diseases: Secondary | ICD-10-CM | POA: Insufficient documentation

## 2018-12-12 DIAGNOSIS — K649 Unspecified hemorrhoids: Secondary | ICD-10-CM | POA: Insufficient documentation

## 2018-12-12 LAB — SARS CORONAVIRUS 2 (TAT 6-24 HRS): SARS Coronavirus 2: NEGATIVE

## 2018-12-12 NOTE — Telephone Encounter (Signed)
Would prefer patient not to be on omeprazole. Potential drug interaction with Celexa with omeprazole increasing Celexa concentrations.

## 2018-12-13 ENCOUNTER — Telehealth: Payer: Self-pay | Admitting: Gastroenterology

## 2018-12-13 ENCOUNTER — Ambulatory Visit (HOSPITAL_COMMUNITY)
Admission: RE | Admit: 2018-12-13 | Discharge: 2018-12-13 | Disposition: A | Discharge status: Home / Self Care | Payer: 59 | Attending: Gastroenterology | Admitting: Gastroenterology

## 2018-12-13 ENCOUNTER — Other Ambulatory Visit: Payer: Self-pay

## 2018-12-13 ENCOUNTER — Encounter (HOSPITAL_COMMUNITY)
Admission: RE | Disposition: A | Discharge status: Home / Self Care | Payer: Self-pay | Source: Home / Self Care | Attending: Gastroenterology

## 2018-12-13 ENCOUNTER — Ambulatory Visit (HOSPITAL_COMMUNITY): Payer: 59 | Admitting: Anesthesiology

## 2018-12-13 ENCOUNTER — Encounter (HOSPITAL_COMMUNITY): Payer: Self-pay | Admitting: *Deleted

## 2018-12-13 DIAGNOSIS — K648 Other hemorrhoids: Secondary | ICD-10-CM | POA: Insufficient documentation

## 2018-12-13 DIAGNOSIS — K921 Melena: Secondary | ICD-10-CM | POA: Insufficient documentation

## 2018-12-13 DIAGNOSIS — K219 Gastro-esophageal reflux disease without esophagitis: Secondary | ICD-10-CM | POA: Diagnosis not present

## 2018-12-13 DIAGNOSIS — K644 Residual hemorrhoidal skin tags: Secondary | ICD-10-CM | POA: Insufficient documentation

## 2018-12-13 DIAGNOSIS — Q439 Congenital malformation of intestine, unspecified: Secondary | ICD-10-CM | POA: Insufficient documentation

## 2018-12-13 HISTORY — PX: COLONOSCOPY WITH PROPOFOL: SHX5780

## 2018-12-13 LAB — PREGNANCY, URINE: Preg Test, Ur: NEGATIVE

## 2018-12-13 SURGERY — COLONOSCOPY WITH PROPOFOL
Anesthesia: General

## 2018-12-13 MED ORDER — PROPOFOL 500 MG/50ML IV EMUL
INTRAVENOUS | Status: DC | PRN
  Administered 2018-12-13: 150 ug/kg/min via INTRAVENOUS

## 2018-12-13 MED ORDER — PROPOFOL 10 MG/ML IV BOLUS
INTRAVENOUS | Status: AC
  Filled 2018-12-13: qty 20

## 2018-12-13 MED ORDER — LIDOCAINE HCL (CARDIAC) PF 100 MG/5ML IV SOSY
PREFILLED_SYRINGE | INTRAVENOUS | Status: DC | PRN
  Administered 2018-12-13: 50 mg via INTRATRACHEAL

## 2018-12-13 MED ORDER — PROMETHAZINE HCL 25 MG/ML IJ SOLN: 6.2500 mg | INTRAMUSCULAR | Status: DC | PRN

## 2018-12-13 MED ORDER — PROPOFOL 500 MG/50ML IV EMUL: INTRAVENOUS | Status: DC | PRN

## 2018-12-13 MED ORDER — HYDROCODONE-ACETAMINOPHEN 7.5-325 MG PO TABS: 1.0000 | ORAL_TABLET | Freq: Once | ORAL | Status: DC | PRN

## 2018-12-13 MED ORDER — MIDAZOLAM HCL 2 MG/2ML IJ SOLN: 0.5000 mg | Freq: Once | INTRAMUSCULAR | Status: DC | PRN

## 2018-12-13 MED ORDER — FENTANYL CITRATE (PF) 100 MCG/2ML IJ SOLN
INTRAMUSCULAR | Status: AC
  Filled 2018-12-13: qty 2

## 2018-12-13 MED ORDER — LACTATED RINGERS IV SOLN
INTRAVENOUS | Status: DC
  Administered 2018-12-13: 12:00:00 via INTRAVENOUS
  Administered 2018-12-13: 1000 mL via INTRAVENOUS

## 2018-12-13 MED ORDER — FENTANYL CITRATE (PF) 100 MCG/2ML IJ SOLN
INTRAMUSCULAR | Status: DC | PRN
  Administered 2018-12-13: 50 ug via INTRAVENOUS
  Administered 2018-12-13 (×2): 25 ug via INTRAVENOUS

## 2018-12-13 MED ORDER — HYDROMORPHONE HCL 1 MG/ML IJ SOLN: 0.2500 mg | INTRAMUSCULAR | Status: DC | PRN

## 2018-12-13 MED ORDER — PROPOFOL 10 MG/ML IV BOLUS
INTRAVENOUS | Status: DC | PRN
  Administered 2018-12-13 (×4): 50 mg via INTRAVENOUS

## 2018-12-13 NOTE — Telephone Encounter (Signed)
I told pt that Bethany Singleton wants her to take the Prevacid and I will do a PA if it needs it. I had called yesterday and got put on hold and had to hang up. She said she does not want to stay with [facility] anyway, she is going to [facility] in [place]. She will call me back and let me know what to do.

## 2018-12-13 NOTE — Op Note (Signed)
[facility] Patient Name: Bethany Singleton Procedure Date: 12/13/2018 11:53 AM MRN: [id number] Date of Birth: September 15, 1990 Attending MD: Jonette EvaSandi Orvan Papadakis MD, MD CSN: [id number] Age: [age] Admit Type: Outpatient Procedure:                Colonoscopy, DIAGNOSTIC Indications:              Hematochezia Providers:                Jonette EvaSandi Delontae Lamm MD, MD, Jannett CelestineAnitra Bell, RN, Burke Keelsrisann                            Tilley, Technician Referring MD:             Remus LofflerAngel S. Jones Medicines:                Propofol per Anesthesia Complications:            No immediate complications. Estimated Blood Loss:     Estimated blood loss: none. Procedure:                Pre-Anesthesia Assessment:                           - Prior to the procedure, a History and Physical                            was performed, and patient medications and                            allergies were reviewed. The patient's tolerance of                            previous anesthesia was also reviewed. The risks                            and benefits of the procedure and the sedation                            options and risks were discussed with the patient.                            All questions were answered, and informed consent                            was obtained. Prior Anticoagulants: The patient has                            taken no previous anticoagulant or antiplatelet                            agents. ASA Grade Assessment: I - A normal, healthy                            patient. After reviewing the risks and benefits,  the patient was deemed in satisfactory condition to                            undergo the procedure. After obtaining informed                            consent, the colonoscope was passed under direct                            vision. Throughout the procedure, the patient's                            blood pressure, pulse, and oxygen saturations were   monitored continuously. The PCF-[id number] ([id number])                            scope was introduced through the anus and advanced                            to the 10 cm into the ileum. The colonoscopy was                            somewhat difficult due to a tortuous colon.                            Successful completion of the procedure was aided by                            increasing the dose of sedation medication,                            straightening and shortening the scope to obtain                            bowel loop reduction and COLOWRAP. The patient                            tolerated the procedure fairly well. The quality of                            the bowel preparation was excellent. The terminal                            ileum, ileocecal valve, appendiceal orifice, and                            rectum were photographed. Scope In: 12:06:23 PM Scope Out: 12:21:22 PM Scope Withdrawal Time: 0 hours 10 minutes 32 seconds  Total Procedure Duration: 0 hours 14 minutes 59 seconds  Findings:      The terminal ileum appeared normal.      The recto-sigmoid colon, sigmoid colon and descending colon were       moderately tortuous.      External hemorrhoids were found. The hemorrhoids were moderate.  Internal hemorrhoids were found. The hemorrhoids were small. Impression:               - RECTAL BLEEDING DUE TO HEMORRHOIDS                           - Tortuous LEFT colon. Moderate Sedation:      Per Anesthesia Care Recommendation:           - Patient has a contact number available for                            emergencies. The signs and symptoms of potential                            delayed complications were discussed with the                            patient. Return to normal activities tomorrow.                            Written discharge instructions were provided to the                            patient.                           - High fiber diet. DRINK  WATER.                           - Continue present medications. TAKE AMITIZA WITH                            FIRST MEAL.                           - Repeat colonoscopy at age 63 for surveillance.                           - Return to GI office in 6 months. Procedure Code(s):        --- Professional ---                           [phone], Colonoscopy, flexible; diagnostic, including                            collection of specimen(s) by brushing or washing,                            when performed (separate procedure) Diagnosis Code(s):        --- Professional ---                           K64.4, Residual hemorrhoidal skin tags                           K64.8, Other hemorrhoids  K92.1, Melena (includes Hematochezia)                           Q43.8, Other specified congenital malformations of                            intestine CPT copyright 2019 [employer] Association. All rights reserved. The codes documented in this report are preliminary and upon coder review may  be revised to meet current compliance requirements. Jonette Eva, MD Jonette Eva MD, MD 12/13/2018 12:46:22 PM This report has been signed electronically. Number of Addenda: 0

## 2018-12-13 NOTE — Anesthesia Postprocedure Evaluation (Signed)
Anesthesia Post Note  Patient: ADDIE ALONGE  Procedure(s) Performed: COLONOSCOPY WITH PROPOFOL (N/A )  Patient location during evaluation: Endoscopy Anesthesia Type: General Level of consciousness: awake and alert Pain management: pain level controlled Vital Signs Assessment: post-procedure vital signs reviewed and stable Respiratory status: spontaneous breathing, nonlabored ventilation, respiratory function stable and patient connected to nasal cannula oxygen Cardiovascular status: blood pressure returned to baseline and stable Postop Assessment: no apparent nausea or vomiting Anesthetic complications: no     Last Vitals:  Vitals:   12/13/18 1057 12/13/18 1230  BP: 117/73 106/61  Pulse: 88 86  Resp: 18 18  Temp: 37.3 C 36.6 C  SpO2: 98% 100%    Last Pain:  Vitals:   12/13/18 1230  TempSrc:   PainSc: 0-No pain                 Talitha Givens

## 2018-12-13 NOTE — Anesthesia Preprocedure Evaluation (Signed)
Anesthesia Evaluation  Patient identified by MRN, date of birth, ID band Patient awake    Reviewed: Allergy & Precautions, NPO status , Patient's Chart, lab work & pertinent test results  Airway Mallampati: II  TM Distance: >3 FB Neck ROM: Full    Dental no notable dental hx. (+) Teeth Intact   Pulmonary neg pulmonary ROS,    Pulmonary exam normal breath sounds clear to auscultation       Cardiovascular Exercise Tolerance: Good negative cardio ROS Normal cardiovascular examI Rhythm:Regular Rate:Normal     Neuro/Psych Anxiety negative neurological ROS  negative psych ROS   GI/Hepatic negative GI ROS, Neg liver ROS, Described GERD like Sx - denies any gerd sx or meds currently    Endo/Other  negative endocrine ROS  Renal/GU negative Renal ROS  negative genitourinary   Musculoskeletal negative musculoskeletal ROS (+)   Abdominal   Peds negative pediatric ROS (+)  Hematology negative hematology ROS (+)   Anesthesia Other Findings   Reproductive/Obstetrics negative OB ROS                             Anesthesia Physical Anesthesia Plan  ASA: II  Anesthesia Plan: General   Post-op Pain Management:    Induction: Intravenous  PONV Risk Score and Plan: 3 and TIVA, Propofol infusion, Midazolam, Ondansetron and Treatment may vary due to age or medical condition  Airway Management Planned: Nasal Cannula and Simple Face Mask  Additional Equipment:   Intra-op Plan:   Post-operative Plan:   Informed Consent: I have reviewed the patients History and Physical, chart, labs and discussed the procedure including the risks, benefits and alternatives for the proposed anesthesia with the patient or authorized representative who has indicated his/her understanding and acceptance.     Dental advisory given  Plan Discussed with: CRNA  Anesthesia Plan Comments: (Plan Full PPE use  Plan GA with  GETA as needed d/w pt -WTP with same after Q&A)        Anesthesia Quick Evaluation

## 2018-12-13 NOTE — Telephone Encounter (Signed)
Pt said she's been out of her medicine for a week and needed it called into [facility] in [place]. She said it was AMitiza and pantoprazole. Please call her at [phone]

## 2018-12-13 NOTE — Transfer of Care (Signed)
Immediate Anesthesia Transfer of Care Note  Patient: Bethany Singleton  Procedure(s) Performed: COLONOSCOPY WITH PROPOFOL (N/A )  Patient Location: PACU  Anesthesia Type:General  Level of Consciousness: awake, alert  and oriented  Airway & Oxygen Therapy: Patient Spontanous Breathing and Patient connected to nasal cannula oxygen  Post-op Assessment: Report given to RN, Post -op Vital signs reviewed and stable and Patient moving all extremities X 4  Post vital signs: Reviewed and stable  Last Vitals:  Vitals Value Taken Time  BP 106/61 12/13/18 1230  Temp 36.6 C 12/13/18 1230  Pulse 83 12/13/18 1235  Resp 16 12/13/18 1235  SpO2 100 % 12/13/18 1235  Vitals shown include unvalidated device data.  Last Pain:  Vitals:   12/13/18 1057  TempSrc: Oral  PainSc: 0-No pain      Patients Stated Pain Goal: 8 (96/04/54 0981)  Complications: No apparent anesthesia complications

## 2018-12-13 NOTE — H&P (Signed)
Primary Care Physician:  Remus Loffler, PA-C Primary Gastroenterologist:  Dr. Darrick Penna  Pre-Procedure History & Physical: HPI:  Bethany Singleton is a 28 y.o. female here for BRBPR.  Past Medical History:  Diagnosis Date  . Anxiety     Past Surgical History:  Procedure Laterality Date  . lymph node removal from behind R ear      Prior to Admission medications   Medication Sig Start Date End Date Taking? Authorizing Provider  citalopram (CELEXA) 20 MG tablet Take 1 tablet (20 mg total) by mouth daily. 10/28/18  Yes Daphine Deutscher, Mary-Margaret, FNP  lansoprazole (PREVACID) 30 MG capsule Take 1 capsule (30 mg total) by mouth daily at 12 noon. 12/07/18  Yes Letta Median, PA-C  lubiprostone (AMITIZA) 8 MCG capsule Take 1 capsule (8 mcg total) by mouth 2 (two) times daily with a meal. 12/07/18  Yes Ermalinda Memos S, PA-C    Allergies as of 12/08/2018 - Review Complete 12/08/2018  Allergen Reaction Noted  . Latex      Family History  Problem Relation Age of Onset  . Colon cancer Neg Hx   . Colon polyps Neg Hx   . Inflammatory bowel disease Neg Hx     Social History   Socioeconomic History  . Marital status: Single    Spouse name: Not on file  . Number of children: Not on file  . Years of education: Not on file  . Highest education level: Not on file  Occupational History  . Not on file  Social Needs  . Financial resource strain: Not on file  . Food insecurity    Worry: Not on file    Inability: Not on file  . Transportation needs    Medical: Not on file    Non-medical: Not on file  Tobacco Use  . Smoking status: Never Smoker  . Smokeless tobacco: Never Used  Substance and Sexual Activity  . Alcohol use: Yes    Comment: weekends- glass or two of wine  . Drug use: No  . Sexual activity: Yes    Birth control/protection: Injection  Lifestyle  . Physical activity    Days per week: Not on file    Minutes per session: Not on file  . Stress: Not on file  Relationships   . Social Musician on phone: Not on file    Gets together: Not on file    Attends religious service: Not on file    Active member of club or organization: Not on file    Attends meetings of clubs or organizations: Not on file    Relationship status: Not on file  . Intimate partner violence    Fear of current or ex partner: Not on file    Emotionally abused: Not on file    Physically abused: Not on file    Forced sexual activity: Not on file  Other Topics Concern  . Not on file  Social History Narrative  . Not on file    Review of Systems: See HPI, otherwise negative ROS   Physical Exam: BP 117/73   Pulse 88   Temp 99.1 F (37.3 C) (Oral)   Resp 18   LMP 11/14/2018 (Approximate)   SpO2 98%  General:   Alert,  pleasant and cooperative in NAD Head:  Normocephalic and atraumatic. Neck:  Supple; Lungs:  Clear throughout to auscultation.    Heart:  Regular rate and rhythm. Abdomen:  Soft, nontender and nondistended. Normal bowel sounds,  without guarding, and without rebound.   Neurologic:  Alert and  oriented x4;  grossly normal neurologically.  Impression/Plan:    BRBPR  PLAN: TCS/?hemorrhoid banding TODAY. DISCUSSED PROCEDURE, BENEFITS, & RISKS: < 1% chance of medication reaction, bleeding, perforation, ASPIRATION, PELVIC VEIN SEPSIS, or rupture of spleen/liver.

## 2018-12-13 NOTE — Discharge Instructions (Signed)
You have moderate size EXTERNAL AND SMALL INTERNAL hemorrhoids, WHICH CAN CAUSE RECTAL BLEEDING ESPECIALLY IF YOU STRAIN. THE INTERNAL HEMORRHOIDS WERE NOT LARGE ENOUGH TO BAND. YOU DID NOT HAVE ANY POLYPS.  TO AVOID CONSTIPATION YOU SHOULD: 1. DRINK WATER TO KEEP YOUR URINE LIGHT YELLOW. 2. FOLLOW A HIGH FIBER DIET. AVOID ITEMS THAT CAUSE BLOATING & GAS. 3. TAKE AMITIZA WITH YOUR FIRST MEAL. YOU SHOULD HAVE A BM 1-3 HRS AFTER THE DOSE IF NOT TAKE THE LINZESS WITH BREAKFAST. IT CAN CAUSE EXPLOSIVE DIARRHEA. 4. USE MILK OF MAGNESIA PILLS OR LIQUID UP TO THREE TIMES A DAY TO REDUCE CONSTIPATION.   USE PREPARATION H FOUR TIMES  A DAY IF NEEDED TO RELIEVE RECTAL PAIN/PRESSURE/BLEEDING.   FOLLOW UP IN 6 MOS : Aliene Altes, [place] on  April 9th at 1000am (Nov 9th appt-Cancelled)  Next colonoscopy AT AGE 66.  Colonoscopy Care After Read the instructions outlined below and refer to this sheet in the next week. These discharge instructions provide you with general information on caring for yourself after you leave the hospital. While your treatment has been planned according to the most current medical practices available, unavoidable complications occasionally occur. If you have any problems or questions after discharge, call DR. FIELDS, [phone].  ACTIVITY  You may resume your regular activity, but move at a slower pace for the next 24 hours.   Take frequent rest periods for the next 24 hours.   Walking will help get rid of the air and reduce the bloated feeling in your belly (abdomen).   No driving for 24 hours (because of the medicine (anesthesia) used during the test).   You may shower.   Do not sign any important legal documents or operate any machinery for 24 hours (because of the anesthesia used during the test).    NUTRITION  Drink plenty of fluids.   You may resume your normal diet as instructed by your doctor.   Begin with a light meal and progress to your normal diet.  Heavy or fried foods are harder to digest and may make you feel sick to your stomach (nauseated).   Avoid alcoholic beverages for 24 hours or as instructed.    MEDICATIONS  You may resume your normal medications.   WHAT YOU CAN EXPECT TODAY  Some feelings of bloating in the abdomen.   Passage of more gas than usual.   Spotting of blood in your stool or on the toilet paper  .  IF YOU HAD POLYPS REMOVED DURING THE COLONOSCOPY:  Eat a soft diet IF YOU HAVE NAUSEA, BLOATING, ABDOMINAL PAIN, OR VOMITING.    FINDING OUT THE RESULTS OF YOUR TEST Not all test results are available during your visit. DR. Oneida Alar WILL CALL YOU WITHIN 14 DAYS OF YOUR PROCEDUE WITH YOUR RESULTS. Do not assume everything is normal if you have not heard from DR. FIELDS, CALL HER OFFICE AT [phone].  SEEK IMMEDIATE MEDICAL ATTENTION AND CALL THE OFFICE: [phone] IF:  You have more than a spotting of blood in your stool.   Your belly is swollen (abdominal distention).   You are nauseated or vomiting.   You have a temperature over 101F.   You have abdominal pain or discomfort that is severe or gets worse throughout the day.  High-Fiber Diet A high-fiber diet changes your normal diet to include more whole grains, legumes, fruits, and vegetables. Changes in the diet involve replacing refined carbohydrates with unrefined foods. The calorie level of the diet is essentially  unchanged. The Dietary Reference Intake (recommended amount) for adult males is 38 grams per day. For adult females, it is 25 grams per day. Pregnant and lactating women should consume 28 grams of fiber per day. Fiber is the intact part of a plant that is not broken down during digestion. Functional fiber is fiber that has been isolated from the plant to provide a beneficial effect in the body.  PURPOSE  Increase stool bulk.   Ease and regulate bowel movements.   Lower cholesterol.   REDUCE RISK OF COLON CANCER  INDICATIONS  THAT YOU NEED MORE FIBER  Constipation and hemorrhoids.   Uncomplicated diverticulosis (intestine condition) and irritable bowel syndrome.   Weight management.   As a protective measure against hardening of the arteries (atherosclerosis), diabetes, and cancer.   GUIDELINES FOR INCREASING FIBER IN THE DIET  Start adding fiber to the diet slowly. A gradual increase of about 5 more grams (2 servings of most fruits or vegetables) per day is best. Too rapid an increase in fiber may result in constipation, flatulence, and bloating.   Drink enough water and fluids to keep your urine clear or pale yellow. Water, juice, or caffeine-free drinks are recommended. Not drinking enough fluid may cause constipation.   Eat a variety of high-fiber foods rather than one type of fiber.   Try to increase your intake of fiber through using high-fiber foods rather than fiber pills or supplements that contain small amounts of fiber.   The goal is to change the types of food eaten. Do not supplement your present diet with high-fiber foods, but replace foods in your present diet.    Hemorrhoids Hemorrhoids are dilated (enlarged) veins around the rectum. Sometimes clots will form in the veins. This makes them swollen and painful. These are called thrombosed hemorrhoids. Causes of hemorrhoids include:  Constipation.   Straining to have a bowel movement.   HEAVY LIFTING  HOME CARE INSTRUCTIONS  Eat a well balanced diet and drink 6 to 8 glasses of water every day to avoid constipation. You may also use a bulk laxative.   Avoid straining to have bowel movements.   Keep anal area dry and clean.   Do not use a donut shaped pillow or sit on the toilet for long periods. This increases blood pooling and pain.   Move your bowels when your body has the urge; this will require less straining and will decrease pain and pressure.

## 2018-12-14 NOTE — Telephone Encounter (Signed)
Pt changed pharmacy to [facility] in [place]. She said she has a message to come and pick up the Lansoprazole, but the Amitiza will need PA.

## 2018-12-14 NOTE — Telephone Encounter (Signed)
I spoke to pt this morning. She changed pharmacy to [facility] in [place]. She got a message that the Lansoprazole has been filled but the Amitiza will need a PA.  Sending FYI to Aliene Altes, [place].

## 2018-12-14 NOTE — Telephone Encounter (Signed)
NotedTamela Oddi, are you starting the PA for Amitiza?

## 2018-12-14 NOTE — Addendum Note (Signed)
Addendum  created 12/14/18 0743 by Mickel Baas, CRNA   Charge Capture section accepted

## 2018-12-15 ENCOUNTER — Telehealth: Payer: Self-pay

## 2018-12-15 NOTE — Telephone Encounter (Signed)
I have started the PA for Bethany Singleton.

## 2018-12-15 NOTE — Telephone Encounter (Signed)
See separate phone note.

## 2018-12-15 NOTE — Telephone Encounter (Signed)
I have started the PA for the Amitiza 8 mcg.

## 2018-12-20 ENCOUNTER — Encounter (HOSPITAL_COMMUNITY): Payer: Self-pay | Admitting: Gastroenterology

## 2018-12-20 ENCOUNTER — Telehealth: Payer: Self-pay | Admitting: Gastroenterology

## 2018-12-20 NOTE — Telephone Encounter (Signed)
Pt said she tried Linzess years ago, it was too expensive also. She is aware I am working on this.

## 2018-12-20 NOTE — Telephone Encounter (Signed)
[phone]  Please call patient, she received a letter in the mail that her medication was denied.  The latter stated that she has not tried linzess and she said that she has.

## 2018-12-22 NOTE — Telephone Encounter (Signed)
PA done and approved. Approval sent to the pharmacy. 

## 2018-12-27 ENCOUNTER — Other Ambulatory Visit: Payer: Self-pay | Admitting: *Deleted

## 2018-12-27 DIAGNOSIS — Z20822 Contact with and (suspected) exposure to covid-19: Secondary | ICD-10-CM

## 2018-12-28 LAB — NOVEL CORONAVIRUS, NAA: SARS-CoV-2, NAA: NOT DETECTED

## 2019-01-09 ENCOUNTER — Ambulatory Visit: Payer: 59 | Admitting: Gastroenterology

## 2019-01-11 IMAGING — CR DG LUMBAR SPINE COMPLETE 4+V
5 series · 5 of 5 positions shown · non-contrast
Comparison: Abdominal radiographs dated 11/10/2012

CLINICAL DATA: Back pain for several days.

EXAM:
LUMBAR SPINE - COMPLETE 4+ VIEW

[t l-spine a.p.]
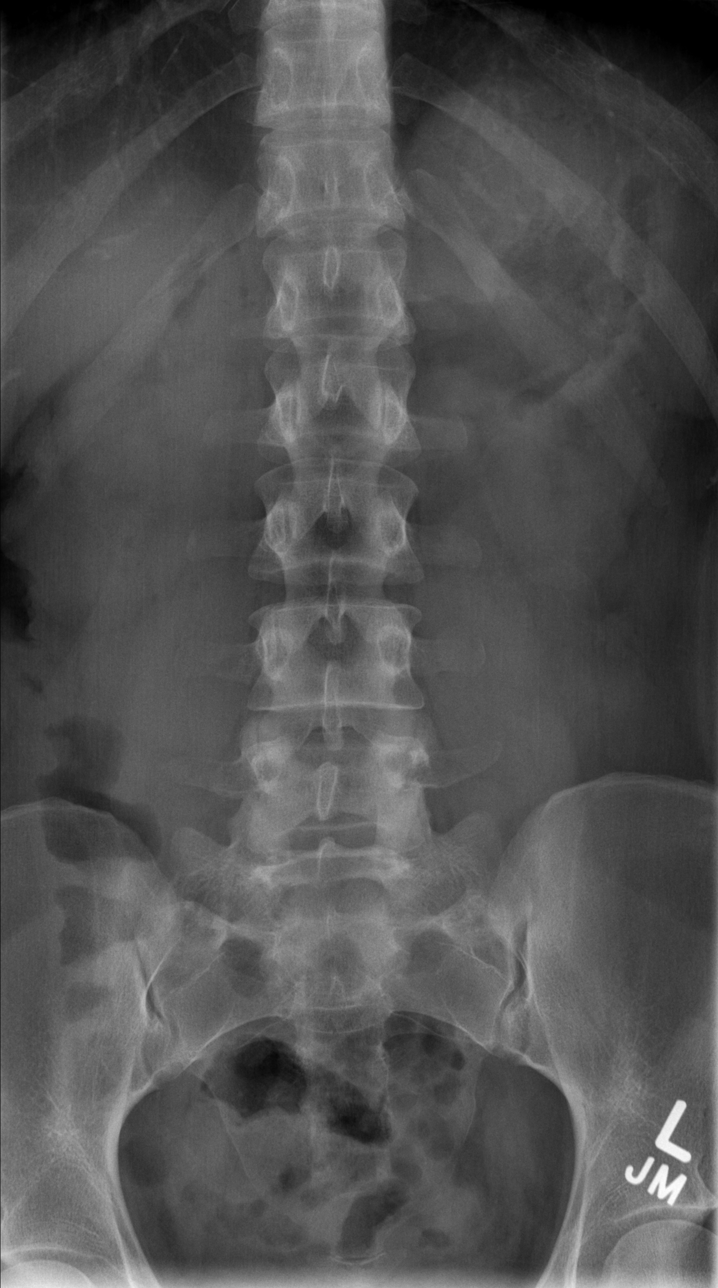

[t l-spine oblique exposure (1 of 2)]
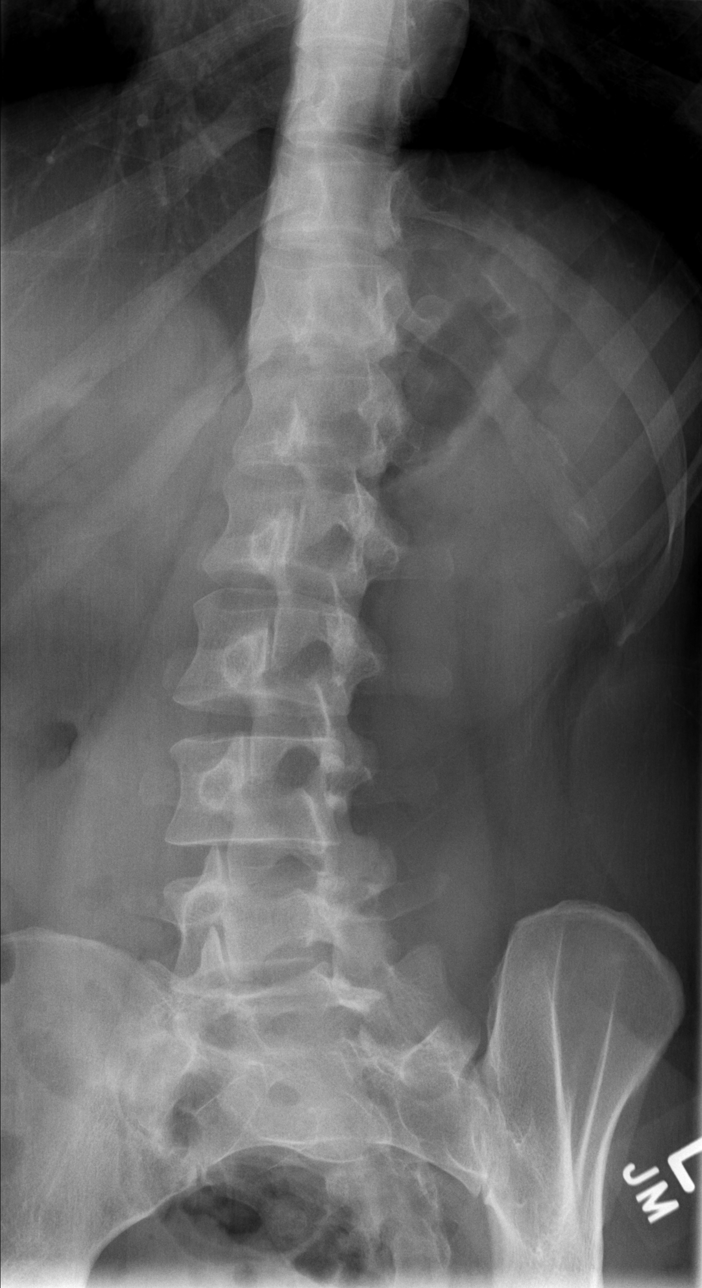

[t l-spine oblique exposure (2 of 2)]
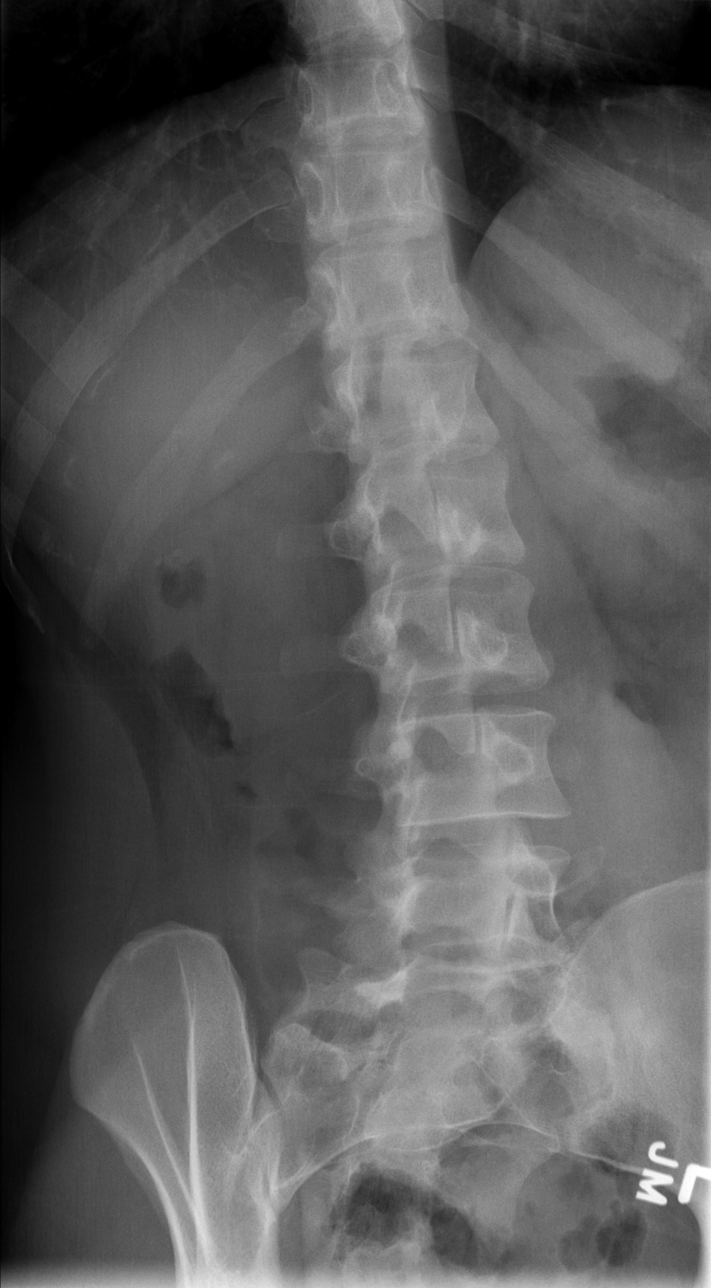

[t l-spine lat]
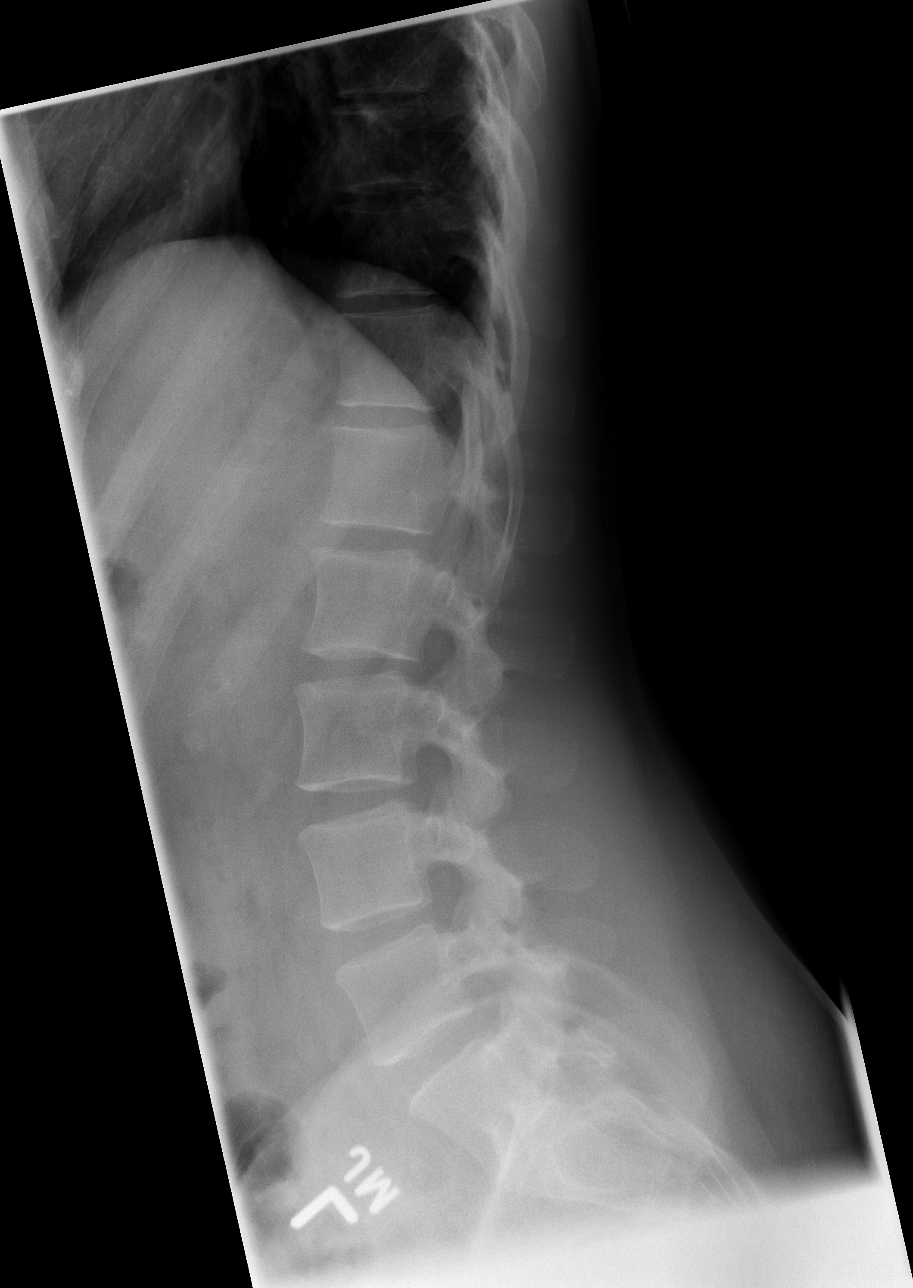

[t l-spine l5-s1 spot]
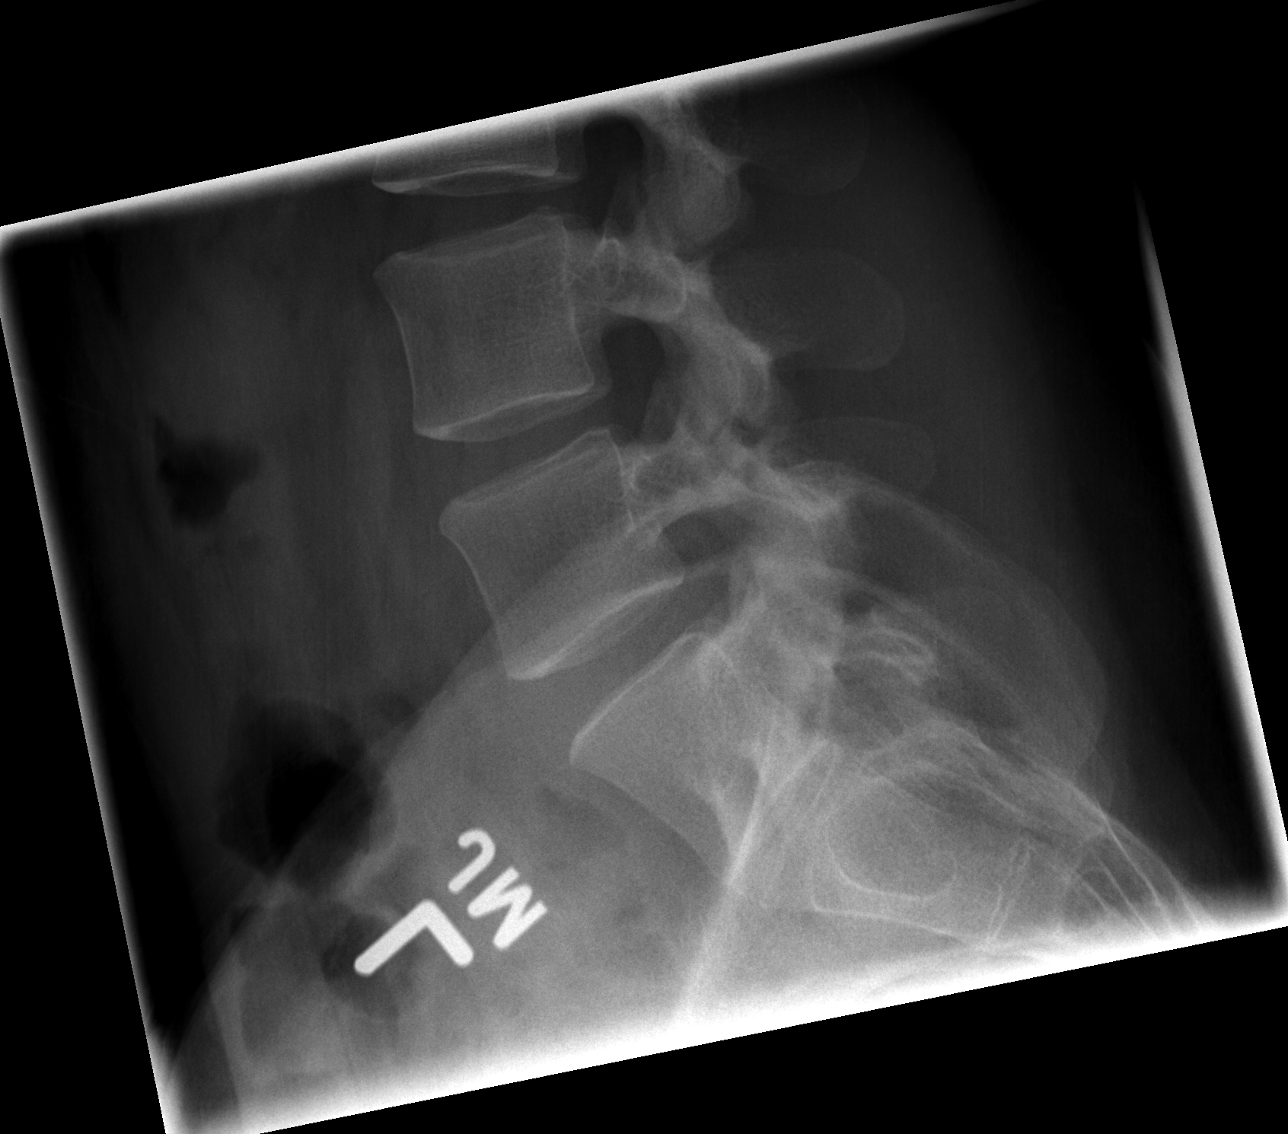

[5 of 5 positions shown; findings below may reference images not displayed]

FINDINGS: There is no evidence of lumbar spine fracture. Alignment is normal.
Intervertebral disc spaces are maintained. No facet arthritis.
IMPRESSION: Negative.

## 2019-01-22 ENCOUNTER — Encounter: Payer: Self-pay | Admitting: Physician Assistant

## 2019-01-24 ENCOUNTER — Other Ambulatory Visit: Payer: Self-pay | Admitting: Physician Assistant

## 2019-01-24 DIAGNOSIS — Z1379 Encounter for other screening for genetic and chromosomal anomalies: Secondary | ICD-10-CM

## 2019-02-06 ENCOUNTER — Telehealth: Payer: Self-pay

## 2019-02-06 NOTE — Telephone Encounter (Signed)
Received a drug change request from [facility], [place]. Pt was able to fill Lansoprazole 30 mg #90 capsules but it will no longer be covered under pts insurance. The preferred alternative is Omeprazole. Spoke with pt, pt has tried Protonix previously and per [facility], Omeprazole will not be the appropriate medication for pt to try.  Routing to DS, a PA will most likely be needed in order for pt to continue to receive Lansoprazole.

## 2019-02-07 NOTE — Telephone Encounter (Signed)
Noted, will work on this.

## 2019-02-09 ENCOUNTER — Encounter: Payer: Self-pay | Admitting: Family Medicine

## 2019-02-09 ENCOUNTER — Encounter: Payer: Self-pay | Admitting: Physician Assistant

## 2019-02-09 ENCOUNTER — Ambulatory Visit (INDEPENDENT_AMBULATORY_CARE_PROVIDER_SITE_OTHER): Payer: 59 | Admitting: Family Medicine

## 2019-02-09 DIAGNOSIS — F411 Generalized anxiety disorder: Secondary | ICD-10-CM | POA: Diagnosis not present

## 2019-02-09 MED ORDER — AMOXICILLIN 500 MG PO CAPS: 500.0000 mg | ORAL_CAPSULE | Freq: Three times a day (TID) | ORAL | 0 refills | Status: DC

## 2019-02-09 NOTE — Progress Notes (Signed)
    Subjective:    Patient ID: Bethany Singleton, female    DOB: April 28, 1990, 28 y.o.   MRN: [id number]   HPI: Bethany Singleton is a 28 y.o. female presenting for bladder infection. Painful urination for 1.5 weeks. Constant cramps. Has to pee every few minutes.  Tried cranberry pills. Worse today. Denies fever. A little nauseated for a couple of days.   Depression screen [facility] 2/9 10/28/2018 09/16/2016  Decreased Interest 0 1  Down, Depressed, Hopeless 0 0  PHQ - 2 Score 0 1     Relevant past medical, surgical, family and social history reviewed and updated as indicated.  Interim medical history since our last visit reviewed. Allergies and medications reviewed and updated.  ROS:  Review of Systems  Constitutional: Negative for chills, diaphoresis and fever.  HENT: Negative for congestion.   Eyes: Negative for visual disturbance.  Respiratory: Negative for cough and shortness of breath.   Cardiovascular: Negative for chest pain and palpitations.  Gastrointestinal: Negative for constipation, diarrhea and nausea.  Genitourinary: Positive for dysuria, frequency and urgency. Negative for decreased urine volume, flank pain, hematuria, menstrual problem and pelvic pain.  Musculoskeletal: Negative for arthralgias and joint swelling.  Skin: Negative for rash.  Neurological: Negative for dizziness and numbness.     Social History   Tobacco Use  Smoking Status Never Smoker  Smokeless Tobacco Never Used       Objective:     Wt Readings from Last 3 Encounters:  12/07/18 143 lb 3.2 oz (65 kg)  10/28/18 144 lb (65.3 kg)  03/12/17 155 lb (70.3 kg)     Exam deferred. Pt. [place] due to COVID 19. Phone visit performed.   Assessment & Plan:   1. GAD (generalized anxiety disorder)     Meds ordered this encounter  Medications  . amoxicillin (AMOXIL) 500 MG capsule    Sig: Take 1 capsule (500 mg total) by mouth 3 (three) times daily.    Dispense:  21 capsule    Refill:  0    No  orders of the defined types were placed in this encounter.     Diagnoses and all orders for this visit:  GAD (generalized anxiety disorder)  Other orders -     amoxicillin (AMOXIL) 500 MG capsule; Take 1 capsule (500 mg total) by mouth 3 (three) times daily.    Virtual Visit via telephone Note  I discussed the limitations, risks, security and privacy concerns of performing an evaluation and management service by telephone and the availability of in person appointments. The patient was identified with two identifiers. Pt.expressed understanding and agreed to proceed. Pt. Is at home. Dr. Livia Snellen is in his office.  Follow Up Instructions:   I discussed the assessment and treatment plan with the patient. The patient was provided an opportunity to ask questions and all were answered. The patient agreed with the plan and demonstrated an understanding of the instructions.   The patient was advised to call back or seek an in-person evaluation if the symptoms worsen or if the condition fails to improve as anticipated.   Total minutes including chart review and phone contact time: 8   Follow up plan: Return if symptoms worsen or fail to improve.  Bethany Fraise, MD [place]

## 2019-03-21 ENCOUNTER — Telehealth: Payer: Self-pay

## 2019-03-21 NOTE — Telephone Encounter (Signed)
I have started the PA for Lansoprazole.

## 2019-03-27 NOTE — Telephone Encounter (Signed)
I received a letter from [employer] that a PA is not required for the Lansoprazole. I spoke to Lupita Leash at [facility] in [place] and she said she has the Rx ready for the pt. The insurance will only cover 30 days at a time. Pt is aware.

## 2019-06-02 ENCOUNTER — Other Ambulatory Visit: Payer: Self-pay | Admitting: Nurse Practitioner

## 2019-06-02 DIAGNOSIS — F411 Generalized anxiety disorder: Secondary | ICD-10-CM

## 2019-06-09 ENCOUNTER — Ambulatory Visit: Payer: 59 | Admitting: Gastroenterology

## 2019-07-02 ENCOUNTER — Other Ambulatory Visit: Payer: Self-pay | Admitting: Family Medicine

## 2019-07-02 DIAGNOSIS — F411 Generalized anxiety disorder: Secondary | ICD-10-CM

## 2019-07-03 ENCOUNTER — Telehealth (INDEPENDENT_AMBULATORY_CARE_PROVIDER_SITE_OTHER): Payer: 59 | Admitting: Family Medicine

## 2019-07-03 ENCOUNTER — Encounter: Payer: Self-pay | Admitting: Family Medicine

## 2019-07-03 DIAGNOSIS — F411 Generalized anxiety disorder: Secondary | ICD-10-CM

## 2019-07-03 DIAGNOSIS — Z3200 Encounter for pregnancy test, result unknown: Secondary | ICD-10-CM

## 2019-07-03 MED ORDER — CITALOPRAM HYDROBROMIDE 40 MG PO TABS: 40.0000 mg | ORAL_TABLET | Freq: Every day | ORAL | 1 refills | Status: DC

## 2019-07-03 NOTE — Telephone Encounter (Signed)
Appointment scheduled.

## 2019-07-03 NOTE — Progress Notes (Signed)
Subjective:  Patient ID: Bethany Singleton, female    DOB: 13-Feb-1991  Age: 29 y.o. MRN: [id number]  CC: No chief complaint on file.   HPI [employer] presents for anxiety through the roof.  Cites work situations.  She is also trying to get  pregnant and going through IUI.  Patient has been taking citalopram 20 mg a day and it just does not seem to be holding her at times when there are tough moments.  It is okay from day-to-day but has breakthrough intermittently.  She has discontinued all of her medications other than the citalopram.  She is heard that Zoloft is safe for pregnancy and wonders if it would be a good option.  Depression screen [facility] 2/9 10/28/2018 09/16/2016  Decreased Interest 0 1  Down, Depressed, Hopeless 0 0  PHQ - 2 Score 0 1    History Arlanda has a past medical history of Anxiety.   She has a past surgical history that includes lymph node removal from behind R ear and Colonoscopy with propofol (N/A, 12/13/2018).   Her family history is not on file.She reports that she has never smoked. She has never used smokeless tobacco. She reports current alcohol use. She reports that she does not use drugs.    ROS Review of Systems  Constitutional: Negative.   HENT: Negative.   Eyes: Negative for visual disturbance.  Respiratory: Negative for shortness of breath.   Cardiovascular: Negative for chest pain.  Gastrointestinal: Negative for abdominal pain.  Musculoskeletal: Negative for arthralgias.    Objective:  There were no vitals taken for this visit.  BP Readings from Last 3 Encounters:  12/13/18 108/69  12/07/18 115/81  10/28/18 117/77    Wt Readings from Last 3 Encounters:  12/07/18 143 lb 3.2 oz (65 kg)  10/28/18 144 lb (65.3 kg)  03/12/17 155 lb (70.3 kg)  Seeking pregnancy note   Physical Exam  Exam deferred. Pt. Harboring due to COVID 19. Phone visit performed.   Assessment & Plan:   Diagnoses and all orders for this  visit:  GAD (generalized anxiety disorder) -     citalopram (CELEXA) 40 MG tablet; Take 1 tablet (40 mg total) by mouth daily. (Need to see new PCP)  Possible pregnancy, not yet confirmed   Patient is undergoing it IUI procedures.  She is not sure yet if she is pregnant.  She will find out in 3 days.  We reviewed Zoloft versus Celexa with regard to safety in pregnancy.  They appear fairly equal with the only information being that withdrawal from the SSRI can occur at birth.  Therefore the patient was told to inform the presumptive pediatrician when the time comes for delivery.  There is no dose related information so increasing the Celexa to handle the increased stress levels was performed as well.    I have discontinued Analisse H. Mort's lubiprostone, lansoprazole, and amoxicillin. I have also changed her citalopram.  Allergies as of 07/03/2019      Reactions   Latex    Band aids primarily, when left on too long they remove skin      Medication List       Accurate as of Jul 03, 2019  9:21 PM. If you have any questions, ask your nurse or doctor.        STOP taking these medications   amoxicillin 500 MG capsule Commonly known as: AMOXIL Stopped by: Claretta Fraise, MD   lansoprazole 30 MG capsule Commonly known  as: PREVACID Stopped by: Mechele Claude, MD   lubiprostone 8 MCG capsule Commonly known as: Amitiza Stopped by: Mechele Claude, MD     TAKE these medications   citalopram 40 MG tablet Commonly known as: CELEXA Take 1 tablet (40 mg total) by mouth daily. (Need to see new PCP) What changed:   medication strength  how much to take Changed by: Mechele Claude, MD      Virtual Visit via telephone Note  I discussed the limitations, risks, security and privacy concerns of performing an evaluation and management service by telephone and the availability of in person appointments. I also discussed with the patient that there may be a patient responsible charge  related to this service. The patient expressed understanding and agreed to proceed. Pt. Is at home. Dr. Darlyn Read is in his office.  Follow Up Instructions:   I discussed the assessment and treatment plan with the patient. The patient was provided an opportunity to ask questions and all were answered. The patient agreed with the plan and demonstrated an understanding of the instructions.   The patient 6advised to call back or seek an in-person evaluation if the symptoms worsen or if the condition fails to improve as anticipated.  Total minutes including chart review and phone contact time: 18   Follow-up: Return in about 6 weeks (around 08/14/2019), or if symptoms worsen or fail to improve.  Mechele Claude, M.D.

## 2019-07-03 NOTE — Telephone Encounter (Signed)
Former Yetta Barre. NTBS 30 days given 06/05/19

## 2020-01-02 ENCOUNTER — Other Ambulatory Visit: Payer: Self-pay | Admitting: Family Medicine

## 2020-01-02 DIAGNOSIS — F411 Generalized anxiety disorder: Secondary | ICD-10-CM

## 2020-01-16 ENCOUNTER — Ambulatory Visit (INDEPENDENT_AMBULATORY_CARE_PROVIDER_SITE_OTHER): Payer: 59 | Admitting: Family Medicine

## 2020-01-16 ENCOUNTER — Ambulatory Visit (INDEPENDENT_AMBULATORY_CARE_PROVIDER_SITE_OTHER): Payer: 59

## 2020-01-16 ENCOUNTER — Encounter: Payer: Self-pay | Admitting: Family Medicine

## 2020-01-16 ENCOUNTER — Other Ambulatory Visit: Payer: Self-pay

## 2020-01-16 VITALS — BP 129/84 | HR 87 | Temp 98.6°F | Ht 64.0 in | Wt 184.0 lb

## 2020-01-16 DIAGNOSIS — F411 Generalized anxiety disorder: Secondary | ICD-10-CM | POA: Diagnosis not present

## 2020-01-16 DIAGNOSIS — M79671 Pain in right foot: Secondary | ICD-10-CM

## 2020-01-16 DIAGNOSIS — M79672 Pain in left foot: Secondary | ICD-10-CM | POA: Diagnosis not present

## 2020-01-16 DIAGNOSIS — M545 Low back pain, unspecified: Secondary | ICD-10-CM

## 2020-01-16 MED ORDER — CITALOPRAM HYDROBROMIDE 40 MG PO TABS: ORAL_TABLET | ORAL | 1 refills | Status: DC

## 2020-01-16 MED ORDER — PREDNISONE 10 MG PO TABS: ORAL_TABLET | ORAL | 0 refills | Status: DC

## 2020-01-16 NOTE — Patient Instructions (Signed)

## 2020-01-16 NOTE — Progress Notes (Signed)
Subjective:  Patient ID: Bethany Singleton, female    DOB: [birth year]  Age: 29 y.o. MRN: [id number]  CC: Follow-up (Depression, Anxiety)   HPI Bethany Singleton presents for excruciating pain in her heels when she is walking.  This is been increasing over the last 1 to 2 months.  She is noted through the same timeframe that she is having similarly increasing pain at the midline of her lower back just above her buttocks.  Somewhat worse with bending over. The heel pain has not responded to insole inserts  Patient is trying to get pregnant.  Currently she and her spouse are waiting until the end of December to harvest her eggs for in vitro fertilization.  Patient states that the increase in the dose of Celexa from her last visit has made a significant difference in her anxiety and depression.  See PHQ and GAD scores noted below.  Depression screen [facility] 2/9 10/28/2018 09/16/2016  Decreased Interest 0 1  Down, Depressed, Hopeless 0 0  PHQ - 2 Score 0 1   GAD 7 : Generalized Anxiety Score 10/28/2018  Nervous, Anxious, on Edge 3  Control/stop worrying 3  Worry too much - different things 2  Trouble relaxing 2  Restless 1  Easily annoyed or irritable 2  Afraid - awful might happen 0  Total GAD 7 Score 13  Anxiety Difficulty Somewhat difficult     History Bethany Singleton has a past medical history of Anxiety.   She has a past surgical history that includes lymph node removal from behind R ear and Colonoscopy with propofol (N/A, 12/13/2018).   Her family history is not on file.She reports that she has never smoked. She has never used smokeless tobacco. She reports current alcohol use. She reports that she does not use drugs.    ROS Review of Systems  Constitutional: Negative.   HENT: Negative.   Eyes: Negative for visual disturbance.  Respiratory: Negative for shortness of breath.   Cardiovascular: Negative for chest pain.  Gastrointestinal: Negative for abdominal pain.   Musculoskeletal: Positive for arthralgias (both heels) and back pain (L%-S1 region).    Objective:  BP 129/84    Pulse 87    Temp 98.6 F (37 C) (Temporal)    Ht 5\' 4"  (1.626 m)    Wt 184 lb (83.5 kg)    BMI 31.58 kg/m   BP Readings from Last 3 Encounters:  01/16/20 129/84  12/13/18 108/69  12/07/18 115/81    Wt Readings from Last 3 Encounters:  01/16/20 184 lb (83.5 kg)  12/07/18 143 lb 3.2 oz (65 kg)  10/28/18 144 lb (65.3 kg)     Physical Exam Constitutional:      General: She is not in acute distress.    Appearance: She is well-developed.  Cardiovascular:     Rate and Rhythm: Normal rate and regular rhythm.  Pulmonary:     Breath sounds: Normal breath sounds.  Musculoskeletal:        General: Tenderness (each heel at the posterior and lateral surface.) present.  Skin:    General: Skin is warm and dry.  Neurological:     Mental Status: She is alert and oriented to person, place, and time.       Assessment & Plan:   Bethany Singleton was seen today for follow-up.  Diagnoses and all orders for this visit:  GAD (generalized anxiety disorder) -     citalopram (CELEXA) 40 MG tablet; TAKE 1 TABLET(40 MG) BY MOUTH DAILY  Pain of both heels -     DG Foot Complete Left; Future -     DG Foot Complete Right; Future  Lumbar pain -     DG Lumbar Spine 2-3 Views; Future  Other orders -     predniSONE (DELTASONE) 10 MG tablet; Take 5 daily for 2 days followed by 4,3,2 and 1 for 2 days each.   She should get heel cups rather than insole inserts.     I am having Bethany Singleton start on predniSONE. I am also having her maintain her metFORMIN and citalopram.  Allergies as of 01/16/2020      Reactions   Latex    Band aids primarily, when left on too long they remove skin      Medication List       Accurate as of January 16, 2020 10:42 PM. If you have any questions, ask your nurse or doctor.        citalopram 40 MG tablet Commonly known as: CELEXA TAKE 1  TABLET(40 MG) BY MOUTH DAILY   metFORMIN 1000 MG tablet Commonly known as: GLUCOPHAGE Take 1,000 mg by mouth 2 (two) times daily.   predniSONE 10 MG tablet Commonly known as: DELTASONE Take 5 daily for 2 days followed by 4,3,2 and 1 for 2 days each. Started by: Mechele Claude, MD      Low back exercises handout given  Follow-up: Return in about 6 months (around 07/15/2020).  Mechele Claude, M.D.

## 2020-03-03 ENCOUNTER — Encounter: Payer: Self-pay | Admitting: Family Medicine

## 2020-03-04 ENCOUNTER — Other Ambulatory Visit: Payer: Self-pay | Admitting: Family Medicine

## 2020-03-04 DIAGNOSIS — M79672 Pain in left foot: Secondary | ICD-10-CM

## 2020-03-04 DIAGNOSIS — M79671 Pain in right foot: Secondary | ICD-10-CM

## 2020-03-12 ENCOUNTER — Ambulatory Visit (INDEPENDENT_AMBULATORY_CARE_PROVIDER_SITE_OTHER): Payer: 59 | Admitting: Podiatry

## 2020-03-12 ENCOUNTER — Ambulatory Visit: Payer: Self-pay

## 2020-03-12 ENCOUNTER — Other Ambulatory Visit: Payer: Self-pay

## 2020-03-12 DIAGNOSIS — M79671 Pain in right foot: Secondary | ICD-10-CM | POA: Diagnosis not present

## 2020-03-12 DIAGNOSIS — M722 Plantar fascial fibromatosis: Secondary | ICD-10-CM

## 2020-03-12 DIAGNOSIS — M79672 Pain in left foot: Secondary | ICD-10-CM

## 2020-03-12 MED ORDER — TRIAMCINOLONE ACETONIDE 10 MG/ML IJ SUSP
10.0000 mg | Freq: Once | INTRAMUSCULAR | Status: AC
  Administered 2020-03-12: 10 mg

## 2020-03-12 NOTE — Patient Instructions (Signed)

## 2020-03-17 NOTE — Progress Notes (Signed)
Subjective:   Patient ID: Bethany Singleton, female   DOB: 30 y.o.   MRN: [id number]   HPI 30 year old female presents the office today for concerns of bilateral heel pain.  She previously tried a Medrol Dosepak without significant improvement.  She does get occasional swelling to the heel but no recent injury or trauma.  She states that this started after she was on hormone therapy and she is not sure if there is any correlation.  She denies any recent injury or trauma.  She has also noticed instruction exercises and overall symptoms continue.  She has no other concerns today.   Review of Systems  All other systems reviewed and are negative.  Past Medical History:  Diagnosis Date  . Anxiety     Past Surgical History:  Procedure Laterality Date  . COLONOSCOPY WITH PROPOFOL N/A 12/13/2018   Procedure: COLONOSCOPY WITH PROPOFOL;  Surgeon: West Bali, MD;  Location: [facility];  Service: Endoscopy;  Laterality: N/A;  12:00pm  . lymph node removal from behind R ear       Current Outpatient Medications:  .  citalopram (CELEXA) 40 MG tablet, TAKE 1 TABLET(40 MG) BY MOUTH DAILY, Disp: 90 tablet, Rfl: 1 .  metFORMIN (GLUCOPHAGE) 1000 MG tablet, Take 1,000 mg by mouth 2 (two) times daily., Disp: , Rfl:  .  predniSONE (DELTASONE) 10 MG tablet, Take 5 daily for 2 days followed by 4,3,2 and 1 for 2 days each., Disp: 30 tablet, Rfl: 0  Allergies  Allergen Reactions  . Latex     Band aids primarily, when left on too long they remove skin         Objective:  Physical Exam  General: AAO x3, NAD  Dermatological: Skin is warm, dry and supple bilateral. There are no open sores, no preulcerative lesions, no rash or signs of infection present.  Vascular: Dorsalis Pedis artery and Posterior Tibial artery pedal pulses are 2/4 bilateral with immedate capillary fill time.There is no pain with calf compression, swelling, warmth, erythema.   Neruologic: Grossly intact via light touch  bilateral. Negative tinel sign.   Musculoskeletal: Tenderness to palpation along the plantar medial tubercle of the calcaneus at the insertion of plantar fascia on the left and right foot. There is no pain along the course of the plantar fascia within the arch of the foot. Plantar fascia appears to be intact. There is no pain with lateral compression of the calcaneus or pain with vibratory sensation. There is no pain along the course or insertion of the achilles tendon. No other areas of tenderness to bilateral lower extremities. Muscular strength 5/5 in all groups tested bilateral.  Gait: Unassisted, Nonantalgic.       Assessment:   Bilateral heel pain, plantar fasciitis     Plan:  -Treatment options discussed including all alternatives, risks, and complications -Etiology of symptoms were discussed -Previous x-rays reviewed with the patient. -As she has attempted oral steroids without significant provement steroid injection was performed today.  See procedure note below. -Plantar fascial brace dispensed x2 -Night splint dispensed x1 -Stretching, icing daily. -Discussed shoe modifications and orthotics.  Procedure: Injection Tendon/Ligament Discussed alternatives, risks, complications and verbal consent was obtained.  Location: Bilateral plantar fascia at the glabrous junction; medial approach. Skin Prep: Alcohol  Injectate: 0.5cc 0.5% marcaine plain, 0.5 cc 2% lidocaine plain and, 1 cc kenalog 10. Disposition: Patient tolerated procedure well. Injection site dressed with a band-aid.  Post-injection care was discussed and return precautions discussed.  Return in about 4 weeks (around 04/09/2020) for plantar fasciitis .  Vivi Barrack DPM

## 2020-04-09 ENCOUNTER — Ambulatory Visit (INDEPENDENT_AMBULATORY_CARE_PROVIDER_SITE_OTHER): Payer: 59 | Admitting: Podiatry

## 2020-04-09 ENCOUNTER — Other Ambulatory Visit: Payer: Self-pay

## 2020-04-09 DIAGNOSIS — M79672 Pain in left foot: Secondary | ICD-10-CM | POA: Diagnosis not present

## 2020-04-09 DIAGNOSIS — M722 Plantar fascial fibromatosis: Secondary | ICD-10-CM | POA: Diagnosis not present

## 2020-04-09 DIAGNOSIS — M79671 Pain in right foot: Secondary | ICD-10-CM | POA: Diagnosis not present

## 2020-04-09 DIAGNOSIS — M779 Enthesopathy, unspecified: Secondary | ICD-10-CM

## 2020-04-09 NOTE — Patient Instructions (Addendum)
You can use Voltaren gel to the ankle   Plantar Fasciitis (Heel Spur Syndrome) with Rehab The plantar fascia is a fibrous, ligament-like, soft-tissue structure that spans the bottom of the foot. Plantar fasciitis is a condition that causes pain in the foot due to inflammation of the tissue. SYMPTOMS   Pain and tenderness on the underneath side of the foot.  Pain that worsens with standing or walking. CAUSES  Plantar fasciitis is caused by irritation and injury to the plantar fascia on the underneath side of the foot. Common mechanisms of injury include:  Direct trauma to bottom of the foot.  Damage to a small nerve that runs under the foot where the main fascia attaches to the heel bone.  Stress placed on the plantar fascia due to bone spurs. RISK INCREASES WITH:   Activities that place stress on the plantar fascia (running, jumping, pivoting, or cutting).  Poor strength and flexibility.  Improperly fitted shoes.  Tight calf muscles.  Flat feet.  Failure to warm-up properly before activity.  Obesity. PREVENTION  Warm up and stretch properly before activity.  Allow for adequate recovery between workouts.  Maintain physical fitness:  Strength, flexibility, and endurance.  Cardiovascular fitness.  Maintain a health body weight.  Avoid stress on the plantar fascia.  Wear properly fitted shoes, including arch supports for individuals who have flat feet.  PROGNOSIS  If treated properly, then the symptoms of plantar fasciitis usually resolve without surgery. However, occasionally surgery is necessary.  RELATED COMPLICATIONS   Recurrent symptoms that may result in a chronic condition.  Problems of the lower back that are caused by compensating for the injury, such as limping.  Pain or weakness of the foot during push-off following surgery.  Chronic inflammation, scarring, and partial or complete fascia tear, occurring more often from repeated  injections.  TREATMENT  Treatment initially involves the use of ice and medication to help reduce pain and inflammation. The use of strengthening and stretching exercises may help reduce pain with activity, especially stretches of the Achilles tendon. These exercises may be performed at home or with a therapist. Your caregiver may recommend that you use heel cups of arch supports to help reduce stress on the plantar fascia. Occasionally, corticosteroid injections are given to reduce inflammation. If symptoms persist for greater than 6 months despite non-surgical (conservative), then surgery may be recommended.   MEDICATION   If pain medication is necessary, then nonsteroidal anti-inflammatory medications, such as aspirin and ibuprofen, or other minor pain relievers, such as acetaminophen, are often recommended.  Do not take pain medication within 7 days before surgery.  Prescription pain relievers may be given if deemed necessary by your caregiver. Use only as directed and only as much as you need.  Corticosteroid injections may be given by your caregiver. These injections should be reserved for the most serious cases, because they may only be given a certain number of times.  HEAT AND COLD  Cold treatment (icing) relieves pain and reduces inflammation. Cold treatment should be applied for 10 to 15 minutes every 2 to 3 hours for inflammation and pain and immediately after any activity that aggravates your symptoms. Use ice packs or massage the area with a piece of ice (ice massage).  Heat treatment may be used prior to performing the stretching and strengthening activities prescribed by your caregiver, physical therapist, or athletic trainer. Use a heat pack or soak the injury in warm water.  SEEK IMMEDIATE MEDICAL CARE IF:  Treatment seems to  offer no benefit, or the condition worsens.  Any medications produce adverse side effects.  EXERCISES- RANGE OF MOTION (ROM) AND STRETCHING  EXERCISES - Plantar Fasciitis (Heel Spur Syndrome) These exercises may help you when beginning to rehabilitate your injury. Your symptoms may resolve with or without further involvement from your physician, physical therapist or athletic trainer. While completing these exercises, remember:   Restoring tissue flexibility helps normal motion to return to the joints. This allows healthier, less painful movement and activity.  An effective stretch should be held for at least 30 seconds.  A stretch should never be painful. You should only feel a gentle lengthening or release in the stretched tissue.  RANGE OF MOTION - Toe Extension, Flexion  Sit with your right / left leg crossed over your opposite knee.  Grasp your toes and gently pull them back toward the top of your foot. You should feel a stretch on the bottom of your toes and/or foot.  Hold this stretch for 10 seconds.  Now, gently pull your toes toward the bottom of your foot. You should feel a stretch on the top of your toes and or foot.  Hold this stretch for 10 seconds. Repeat  times. Complete this stretch 3 times per day.   RANGE OF MOTION - Ankle Dorsiflexion, Active Assisted  Remove shoes and sit on a chair that is preferably not on a carpeted surface.  Place right / left foot under knee. Extend your opposite leg for support.  Keeping your heel down, slide your right / left foot back toward the chair until you feel a stretch at your ankle or calf. If you do not feel a stretch, slide your bottom forward to the edge of the chair, while still keeping your heel down.  Hold this stretch for 10 seconds. Repeat 3 times. Complete this stretch 2 times per day.   STRETCH  Gastroc, Standing  Place hands on wall.  Extend right / left leg, keeping the front knee somewhat bent.  Slightly point your toes inward on your back foot.  Keeping your right / left heel on the floor and your knee straight, shift your weight toward the wall,  not allowing your back to arch.  You should feel a gentle stretch in the right / left calf. Hold this position for 10 seconds. Repeat 3 times. Complete this stretch 2 times per day.  STRETCH  Soleus, Standing  Place hands on wall.  Extend right / left leg, keeping the other knee somewhat bent.  Slightly point your toes inward on your back foot.  Keep your right / left heel on the floor, bend your back knee, and slightly shift your weight over the back leg so that you feel a gentle stretch deep in your back calf.  Hold this position for 10 seconds. Repeat 3 times. Complete this stretch 2 times per day.  STRETCH  Gastrocsoleus, Standing  Note: This exercise can place a lot of stress on your foot and ankle. Please complete this exercise only if specifically instructed by your caregiver.   Place the ball of your right / left foot on a step, keeping your other foot firmly on the same step.  Hold on to the wall or a rail for balance.  Slowly lift your other foot, allowing your body weight to press your heel down over the edge of the step.  You should feel a stretch in your right / left calf.  Hold this position for 10 seconds.  Repeat  this exercise with a slight bend in your right / left knee. Repeat 3 times. Complete this stretch 2 times per day.   STRENGTHENING EXERCISES - Plantar Fasciitis (Heel Spur Syndrome)  These exercises may help you when beginning to rehabilitate your injury. They may resolve your symptoms with or without further involvement from your physician, physical therapist or athletic trainer. While completing these exercises, remember:   Muscles can gain both the endurance and the strength needed for everyday activities through controlled exercises.  Complete these exercises as instructed by your physician, physical therapist or athletic trainer. Progress the resistance and repetitions only as guided.  STRENGTH - Towel Curls  Sit in a chair positioned on a  non-carpeted surface.  Place your foot on a towel, keeping your heel on the floor.  Pull the towel toward your heel by only curling your toes. Keep your heel on the floor. Repeat 3 times. Complete this exercise 2 times per day.  STRENGTH - Ankle Inversion  Secure one end of a rubber exercise band/tubing to a fixed object (table, pole). Loop the other end around your foot just before your toes.  Place your fists between your knees. This will focus your strengthening at your ankle.  Slowly, pull your big toe up and in, making sure the band/tubing is positioned to resist the entire motion.  Hold this position for 10 seconds.  Have your muscles resist the band/tubing as it slowly pulls your foot back to the starting position. Repeat 3 times. Complete this exercises 2 times per day.  Document Released: 02/16/2005 Document Revised: 05/11/2011 Document Reviewed: 05/31/2008 [facility] Patient Information 2014 [place], [place].

## 2020-04-16 NOTE — Progress Notes (Signed)
Subjective: 30 year old female presents to the office today for follow evaluation of heel pain to the plantar fasciitis.  She said overall she is doing much better and the majority discomfort is mostly the lateral aspect of the heel.  No recent injury or trauma any changes otherwise since I last saw her. Denies any systemic complaints such as fevers, chills, nausea, vomiting. No acute changes since last appointment, and no other complaints at this time.   Objective: AAO x3, NAD DP/PT pulses palpable bilaterally, CRT less than 3 seconds There is no significant tenderness palpation of plantar medial tubercle of the calcaneus at the insertion of plantar fascia.  Plantar fascia appears to be intact.  There is slight discomfort on the course the peroneal tendons bilaterally but overall the tendon appears to be intact as well.  There is no edema, erythema.  Flexor, extensor tendons intact.  MMT 5/5. No pain with calf compression, swelling, warmth, erythema  Assessment: Resolving heel pain, plantar fasciitis; peroneal tendinitis  Plan: -All treatment options discussed with the patient including all alternatives, risks, complications.  -Overall doing better.  We will hold off on any oral anti-inflammatories as she cannot take these.  We held off another steroid injection.  Discussed continue stretching, icing daily as well as wearing supportive shoes discussed inserts. -Patient encouraged to call the office with any questions, concerns, change in symptoms.   Vivi Barrack DPM

## 2020-07-15 ENCOUNTER — Ambulatory Visit (INDEPENDENT_AMBULATORY_CARE_PROVIDER_SITE_OTHER): Payer: 59 | Admitting: Family Medicine

## 2020-07-15 ENCOUNTER — Other Ambulatory Visit: Payer: Self-pay

## 2020-07-15 ENCOUNTER — Encounter: Payer: Self-pay | Admitting: Family Medicine

## 2020-07-15 VITALS — BP 121/81 | HR 100 | Temp 98.2°F | Ht 64.0 in | Wt 178.8 lb

## 2020-07-15 DIAGNOSIS — Z3A01 Less than 8 weeks gestation of pregnancy: Secondary | ICD-10-CM

## 2020-07-15 DIAGNOSIS — F411 Generalized anxiety disorder: Secondary | ICD-10-CM

## 2020-07-15 MED ORDER — CITALOPRAM HYDROBROMIDE 40 MG PO TABS: ORAL_TABLET | ORAL | 1 refills | Status: DC

## 2020-07-15 NOTE — Progress Notes (Signed)
Subjective:  Patient ID: Bethany Singleton, female    DOB: [birth year]  Age: 30 y.o. MRN: [id number]  CC: Follow-up   HPI Bethany Singleton presents for recheck of chronic anxiety and depression.  Bethany Singleton states Bethany Singleton is doing quite well.  Bethany Singleton tells me that Bethany Singleton knows if Bethany Singleton is getting irritable to think about whether Bethany Singleton forgot Bethany Singleton medicine that morning or not.  Generally if Bethany Singleton is forgotten Bethany Singleton medicine that is when Bethany Singleton feels irritable by the end of the day.  We discussed the implications of taking medication during pregnancy.  In particular the risk of serotonin syndrome in the newborn.  As result we may want to readdress the use of Celexa during the third trimester.  Bethany Singleton raises the question of use of Zoloft.  Unfortunately that has the same rating for the third trimester.  For now I would like for Bethany Singleton to run that by Bethany Singleton obstetrician.  Bethany Singleton is almost [redacted] weeks pregnant.  Bethany Singleton had in vitro fertilization.  Bethany Singleton is also taking multiple medications prescribed by Bethany Singleton in vitro specialist. GAD 7 : Generalized Anxiety Score 07/15/2020 10/28/2018  Nervous, Anxious, on Edge 0 3  Control/stop worrying 0 3  Worry too much - different things 0 2  Trouble relaxing 0 2  Restless 0 1  Easily annoyed or irritable 0 2  Afraid - awful might happen 0 0  Total GAD 7 Score 0 13  Anxiety Difficulty - Somewhat difficult     Depression screen [facility] 2/9 07/15/2020 10/28/2018 09/16/2016  Decreased Interest 0 0 1  Down, Depressed, Hopeless 0 0 0  PHQ - 2 Score 0 0 1  Altered sleeping 0 - -  Tired, decreased energy 1 - -  Change in appetite 0 - -  Feeling bad or failure about yourself  0 - -  Trouble concentrating 0 - -  Moving slowly or fidgety/restless 0 - -  Suicidal thoughts 0 - -  PHQ-9 Score 1 - -  Difficult doing work/chores Not difficult at all - -    History Bethany Singleton has a past medical history of Anxiety.   Bethany Singleton has a past surgical history that includes lymph node removal from behind R ear and  Colonoscopy with propofol (N/A, 12/13/2018).   Bethany Singleton family history is not on file.Bethany Singleton reports that Bethany Singleton has never smoked. Bethany Singleton has never used smokeless tobacco. Bethany Singleton reports current alcohol use. Bethany Singleton reports that Bethany Singleton does not use drugs.    ROS Review of Systems  Constitutional: Negative.   HENT: Negative.   Eyes: Negative for visual disturbance.  Respiratory: Negative for shortness of breath.   Cardiovascular: Negative for chest pain.  Gastrointestinal: Negative for abdominal pain.  Musculoskeletal: Negative for arthralgias.    Objective:  BP 121/81   Pulse 100   Temp 98.2 F (36.8 C)   Ht 5\' 4"  (1.626 m)   Wt 178 lb 12.8 oz (81.1 kg)   SpO2 98%   BMI 30.69 kg/m   BP Readings from Last 3 Encounters:  07/15/20 121/81  01/16/20 129/84  12/13/18 108/69    Wt Readings from Last 3 Encounters:  07/15/20 178 lb 12.8 oz (81.1 kg)  01/16/20 184 lb (83.5 kg)  12/07/18 143 lb 3.2 oz (65 kg)     Physical Exam Constitutional:      General: Bethany Singleton is not in acute distress.    Appearance: Bethany Singleton is well-developed.  Cardiovascular:     Rate and Rhythm: Normal rate and regular rhythm.  Pulmonary:  Breath sounds: Normal breath sounds.  Skin:    General: Skin is warm and dry.  Neurological:     Mental Status: Bethany Singleton is alert and oriented to person, place, and time.       Assessment & Plan:   Bethany Singleton was seen today for follow-up.  Diagnoses and all orders for this visit:  Less than [redacted] weeks gestation of pregnancy  GAD (generalized anxiety disorder) -     citalopram (CELEXA) 40 MG tablet; TAKE 1 TABLET(40 MG) BY MOUTH DAILY       I have discontinued Bethany Singleton's predniSONE. I am also having Bethany Singleton maintain Bethany Singleton metFORMIN, estradiol, estradiol, progesterone, Prenatal Vit-Fe Fumarate-FA (PRENATAL VITAMIN PO), and citalopram.  Allergies as of 07/15/2020      Reactions   Latex    Band aids primarily, when left on too long they remove skin      Medication List        Accurate as of Jul 15, 2020  4:10 PM. If you have any questions, ask your nurse or doctor.        STOP taking these medications   predniSONE 10 MG tablet Commonly known as: DELTASONE Stopped by: Mechele Claude, MD     TAKE these medications   citalopram 40 MG tablet Commonly known as: CELEXA TAKE 1 TABLET(40 MG) BY MOUTH DAILY   estradiol 2 MG tablet Commonly known as: ESTRACE Take 2 mg by mouth 2 (two) times daily.   estradiol 0.1 MG/24HR patch Commonly known as: VIVELLE-DOT SMARTSIG:1 Patch(s) T-DERMAL Every 3 Days   metFORMIN 1000 MG tablet Commonly known as: GLUCOPHAGE Take 1,000 mg by mouth 2 (two) times daily.   PRENATAL VITAMIN PO Take by mouth.   progesterone 50 MG/ML injection Inject 50 mg into the muscle daily.        Follow-up: I asked Bethany Singleton to follow-up with me at the 27th week of Bethany Singleton pregnancy unless Bethany Singleton obstetrician had already given Bethany Singleton guidance about using the SSRI as Bethany Singleton moves into the third trimester.  If in fact Bethany Singleton gynecologist does address that issue then I do not need to see Bethany Singleton until after Bethany Singleton 6-week postpartum checkup.  Mechele Claude, M.D.

## 2020-08-13 LAB — OB RESULTS CONSOLE RUBELLA ANTIBODY, IGM: Rubella: IMMUNE

## 2020-08-13 LAB — OB RESULTS CONSOLE HEPATITIS B SURFACE ANTIGEN: Hepatitis B Surface Ag: NEGATIVE

## 2020-08-13 LAB — OB RESULTS CONSOLE RPR: RPR: NONREACTIVE

## 2020-08-13 LAB — OB RESULTS CONSOLE VARICELLA ZOSTER ANTIBODY, IGG: Varicella: IMMUNE

## 2020-08-28 LAB — OB RESULTS CONSOLE GC/CHLAMYDIA
Chlamydia: NEGATIVE
Gonorrhea: NEGATIVE

## 2020-08-28 LAB — OB RESULTS CONSOLE HIV ANTIBODY (ROUTINE TESTING): HIV: NONREACTIVE

## 2020-12-18 ENCOUNTER — Other Ambulatory Visit: Payer: Self-pay

## 2020-12-18 ENCOUNTER — Encounter: Payer: Self-pay | Admitting: Registered"

## 2020-12-18 ENCOUNTER — Encounter: Payer: 59 | Attending: Obstetrics and Gynecology | Admitting: Registered"

## 2020-12-18 DIAGNOSIS — O24419 Gestational diabetes mellitus in pregnancy, unspecified control: Secondary | ICD-10-CM | POA: Insufficient documentation

## 2020-12-18 NOTE — Progress Notes (Signed)
Patient was seen on 12/18/20 for Gestational Diabetes self-management class at the [facility]. The following learning objectives were met by the patient during this course:  States the definition of Gestational Diabetes States why dietary management is important in controlling blood glucose Describes the effects each nutrient has on blood glucose levels Demonstrates ability to create a balanced meal plan Demonstrates carbohydrate counting  States when to check blood glucose levels Demonstrates proper blood glucose monitoring techniques States the effect of stress and exercise on blood glucose levels States the importance of limiting caffeine and abstaining from alcohol and smoking  Blood glucose monitor given: Patient has meter and is checking blood sugar prior to class   Patient instructed to monitor glucose levels: FBS: 60 - <95; 1 hour: <140; 2 hour: <120  Patient received handouts: Nutrition Diabetes and Pregnancy, including carb counting list  Patient will be seen for follow-up as needed.

## 2021-01-29 LAB — OB RESULTS CONSOLE GBS: GBS: NEGATIVE

## 2021-02-07 ENCOUNTER — Other Ambulatory Visit: Payer: Self-pay | Admitting: Obstetrics and Gynecology

## 2021-02-10 ENCOUNTER — Other Ambulatory Visit: Payer: Self-pay

## 2021-02-11 ENCOUNTER — Encounter (HOSPITAL_COMMUNITY): Payer: Self-pay | Admitting: Obstetrics and Gynecology

## 2021-02-11 ENCOUNTER — Inpatient Hospital Stay (HOSPITAL_COMMUNITY): Payer: 59 | Admitting: Anesthesiology

## 2021-02-11 ENCOUNTER — Inpatient Hospital Stay (HOSPITAL_COMMUNITY): Payer: 59

## 2021-02-11 ENCOUNTER — Encounter (HOSPITAL_COMMUNITY)
Admission: AD | Disposition: A | Discharge status: Home / Self Care | Payer: Self-pay | Source: Home / Self Care | Attending: Obstetrics and Gynecology

## 2021-02-11 ENCOUNTER — Inpatient Hospital Stay (HOSPITAL_COMMUNITY)
Admission: AD | Admit: 2021-02-11 | Discharge: 2021-02-13 | DRG: 788 | Disposition: A | Discharge status: Home / Self Care | Payer: 59 | Attending: Obstetrics and Gynecology | Admitting: Obstetrics and Gynecology

## 2021-02-11 DIAGNOSIS — D509 Iron deficiency anemia, unspecified: Secondary | ICD-10-CM | POA: Diagnosis present

## 2021-02-11 DIAGNOSIS — Z98891 History of uterine scar from previous surgery: Secondary | ICD-10-CM

## 2021-02-11 DIAGNOSIS — O1490 Unspecified pre-eclampsia, unspecified trimester: Secondary | ICD-10-CM | POA: Diagnosis present

## 2021-02-11 DIAGNOSIS — O99344 Other mental disorders complicating childbirth: Secondary | ICD-10-CM | POA: Diagnosis present

## 2021-02-11 DIAGNOSIS — O323XX0 Maternal care for face, brow and chin presentation, not applicable or unspecified: Secondary | ICD-10-CM | POA: Diagnosis present

## 2021-02-11 DIAGNOSIS — O1404 Mild to moderate pre-eclampsia, complicating childbirth: Principal | ICD-10-CM | POA: Diagnosis present

## 2021-02-11 DIAGNOSIS — O403XX0 Polyhydramnios, third trimester, not applicable or unspecified: Secondary | ICD-10-CM | POA: Diagnosis present

## 2021-02-11 DIAGNOSIS — O139 Gestational [pregnancy-induced] hypertension without significant proteinuria, unspecified trimester: Secondary | ICD-10-CM | POA: Diagnosis present

## 2021-02-11 DIAGNOSIS — O24420 Gestational diabetes mellitus in childbirth, diet controlled: Secondary | ICD-10-CM | POA: Diagnosis present

## 2021-02-11 DIAGNOSIS — O24419 Gestational diabetes mellitus in pregnancy, unspecified control: Secondary | ICD-10-CM | POA: Diagnosis present

## 2021-02-11 DIAGNOSIS — O9902 Anemia complicating childbirth: Secondary | ICD-10-CM | POA: Diagnosis present

## 2021-02-11 DIAGNOSIS — Z20822 Contact with and (suspected) exposure to covid-19: Secondary | ICD-10-CM | POA: Diagnosis present

## 2021-02-11 DIAGNOSIS — Z3A37 37 weeks gestation of pregnancy: Secondary | ICD-10-CM | POA: Diagnosis not present

## 2021-02-11 DIAGNOSIS — F419 Anxiety disorder, unspecified: Secondary | ICD-10-CM | POA: Diagnosis present

## 2021-02-11 DIAGNOSIS — R609 Edema, unspecified: Secondary | ICD-10-CM | POA: Diagnosis present

## 2021-02-11 LAB — GLUCOSE, CAPILLARY
Glucose-Capillary: 80 mg/dL (ref 70–99)
Glucose-Capillary: 77 mg/dL (ref 70–99)
Glucose-Capillary: 82 mg/dL (ref 70–99)
Glucose-Capillary: 100 mg/dL — ABNORMAL HIGH (ref 70–99)

## 2021-02-11 LAB — COMPREHENSIVE METABOLIC PANEL
ALT: 10 U/L (ref 0–44)
ALT: 13 U/L (ref 0–44)
AST: 22 U/L (ref 15–41)
AST: 19 U/L (ref 15–41)
Albumin: 2.2 g/dL — ABNORMAL LOW (ref 3.5–5.0)
Albumin: 2.2 g/dL — ABNORMAL LOW (ref 3.5–5.0)
Alkaline Phosphatase: 101 U/L (ref 38–126)
Alkaline Phosphatase: 101 U/L (ref 38–126)
Anion gap: 8 (ref 5–15)
Anion gap: 8 (ref 5–15)
BUN: 20 mg/dL (ref 6–20)
BUN: 17 mg/dL (ref 6–20)
CO2: 20 mmol/L — ABNORMAL LOW (ref 22–32)
CO2: 22 mmol/L (ref 22–32)
Calcium: 8.7 mg/dL — ABNORMAL LOW (ref 8.9–10.3)
Calcium: 8.7 mg/dL — ABNORMAL LOW (ref 8.9–10.3)
Chloride: 107 mmol/L (ref 98–111)
Chloride: 107 mmol/L (ref 98–111)
Creatinine, Ser: 1.16 mg/dL — ABNORMAL HIGH (ref 0.44–1.00)
Creatinine, Ser: 1.11 mg/dL — ABNORMAL HIGH (ref 0.44–1.00)
GFR, Estimated: >60 mL/min (ref >60)
GFR, Estimated: >60 mL/min (ref >60)
Glucose, Bld: 85 mg/dL (ref 70–99)
Glucose, Bld: 81 mg/dL (ref 70–99)
Potassium: 4 mmol/L (ref 3.5–5.1)
Potassium: 4.3 mmol/L (ref 3.5–5.1)
Sodium: 135 mmol/L (ref 135–145)
Sodium: 137 mmol/L (ref 135–145)
Total Bilirubin: 0.6 mg/dL (ref 0.3–1.2)
Total Bilirubin: 0.5 mg/dL (ref 0.3–1.2)
Total Protein: 5.5 g/dL — ABNORMAL LOW (ref 6.5–8.1)
Total Protein: 5.3 g/dL — ABNORMAL LOW (ref 6.5–8.1)

## 2021-02-11 LAB — RESP PANEL BY RT-PCR (FLU A&B, COVID) ARPGX2
Influenza A by PCR: NEGATIVE
Influenza B by PCR: NEGATIVE
SARS Coronavirus 2 by RT PCR: NEGATIVE

## 2021-02-11 LAB — CBC
HCT: 30.4 % — ABNORMAL LOW (ref 36.0–46.0)
HCT: 32.3 % — ABNORMAL LOW (ref 36.0–46.0)
HCT: 24.9 % — ABNORMAL LOW (ref 36.0–46.0)
Hemoglobin: 9.9 g/dL — ABNORMAL LOW (ref 12.0–15.0)
Hemoglobin: 10.5 g/dL — ABNORMAL LOW (ref 12.0–15.0)
Hemoglobin: 8.2 g/dL — ABNORMAL LOW (ref 12.0–15.0)
MCH: 28.4 pg (ref 26.0–34.0)
MCH: 28.5 pg (ref 26.0–34.0)
MCH: 29.3 pg (ref 26.0–34.0)
MCHC: 32.6 g/dL (ref 30.0–36.0)
MCHC: 32.5 g/dL (ref 30.0–36.0)
MCHC: 32.9 g/dL (ref 30.0–36.0)
MCV: 87.1 fL (ref 80.0–100.0)
MCV: 87.8 fL (ref 80.0–100.0)
MCV: 88.9 fL (ref 80.0–100.0)
Platelets: 158 10*3/uL (ref 150–400)
Platelets: 175 10*3/uL (ref 150–400)
Platelets: 160 10*3/uL (ref 150–400)
RBC: 3.49 MIL/uL — ABNORMAL LOW (ref 3.87–5.11)
RBC: 3.68 MIL/uL — ABNORMAL LOW (ref 3.87–5.11)
RBC: 2.8 MIL/uL — ABNORMAL LOW (ref 3.87–5.11)
RDW: 12.9 % (ref 11.5–15.5)
RDW: 12.9 % (ref 11.5–15.5)
RDW: 12.8 % (ref 11.5–15.5)
WBC: 9.7 10*3/uL (ref 4.0–10.5)
WBC: 9.6 10*3/uL (ref 4.0–10.5)
WBC: 18.6 10*3/uL — ABNORMAL HIGH (ref 4.0–10.5)
nRBC: 0 % (ref 0.0–0.2)
nRBC: 0 % (ref 0.0–0.2)
nRBC: 0 % (ref 0.0–0.2)

## 2021-02-11 LAB — MAGNESIUM: Magnesium: 3.3 mg/dL — ABNORMAL HIGH (ref 1.7–2.4)

## 2021-02-11 LAB — TYPE AND SCREEN
ABO/RH(D): O POS
Antibody Screen: NEGATIVE

## 2021-02-11 LAB — PROTEIN / CREATININE RATIO, URINE
Creatinine, Urine: 51.61 mg/dL
Protein Creatinine Ratio: 2.33 mg/mg{Cre} — ABNORMAL HIGH (ref 0.00–0.15)
Total Protein, Urine: 120 mg/dL

## 2021-02-11 LAB — RPR: RPR Ser Ql: NONREACTIVE

## 2021-02-11 SURGERY — Surgical Case
Anesthesia: Epidural

## 2021-02-11 MED ORDER — OXYTOCIN BOLUS FROM INFUSION: 333.0000 mL | Freq: Once | INTRAVENOUS | Status: DC

## 2021-02-11 MED ORDER — ACETAMINOPHEN 500 MG PO TABS
1000.0000 mg | ORAL_TABLET | Freq: Four times a day (QID) | ORAL | Status: DC
  Administered 2021-02-12 – 2021-02-13 (×6): 1000 mg via ORAL
  Filled 2021-02-11 (×6): qty 2

## 2021-02-11 MED ORDER — AMISULPRIDE (ANTIEMETIC) 5 MG/2ML IV SOLN
10.0000 mg | Freq: Once | INTRAVENOUS | Status: DC | PRN
  Filled 2021-02-11: qty 4

## 2021-02-11 MED ORDER — KETOROLAC TROMETHAMINE 30 MG/ML IJ SOLN
30.0000 mg | Freq: Four times a day (QID) | INTRAMUSCULAR | Status: AC
  Administered 2021-02-12 (×3): 30 mg via INTRAVENOUS
  Filled 2021-02-11 (×3): qty 1

## 2021-02-11 MED ORDER — SODIUM CHLORIDE 0.9% FLUSH: 3.0000 mL | INTRAVENOUS | Status: DC | PRN

## 2021-02-11 MED ORDER — MAGNESIUM SULFATE 40 GM/1000ML IV SOLN
INTRAVENOUS | Status: AC
  Administered 2021-02-11: 4 g via INTRAVENOUS
  Filled 2021-02-11: qty 1000

## 2021-02-11 MED ORDER — TETANUS-DIPHTH-ACELL PERTUSSIS 5-2.5-18.5 LF-MCG/0.5 IM SUSY: 0.5000 mL | PREFILLED_SYRINGE | Freq: Once | INTRAMUSCULAR | Status: DC

## 2021-02-11 MED ORDER — FENTANYL CITRATE (PF) 100 MCG/2ML IJ SOLN: 25.0000 ug | INTRAMUSCULAR | Status: DC | PRN

## 2021-02-11 MED ORDER — MORPHINE SULFATE (PF) 0.5 MG/ML IJ SOLN
INTRAMUSCULAR | Status: DC | PRN
  Administered 2021-02-11: 3 mg via EPIDURAL

## 2021-02-11 MED ORDER — LIDOCAINE HCL (PF) 1 % IJ SOLN
INTRAMUSCULAR | Status: DC | PRN
Start: 2021-02-11 — End: 2021-02-11
  Administered 2021-02-11 (×2): 5 mL via EPIDURAL

## 2021-02-11 MED ORDER — SENNOSIDES-DOCUSATE SODIUM 8.6-50 MG PO TABS
2.0000 | ORAL_TABLET | Freq: Every day | ORAL | Status: DC
  Administered 2021-02-12 – 2021-02-13 (×2): 2 via ORAL
  Filled 2021-02-11 (×2): qty 2

## 2021-02-11 MED ORDER — CEFAZOLIN SODIUM-DEXTROSE 2-3 GM-%(50ML) IV SOLR
INTRAVENOUS | Status: DC | PRN
  Administered 2021-02-11: 2 g via INTRAVENOUS

## 2021-02-11 MED ORDER — SIMETHICONE 80 MG PO CHEW
80.0000 mg | CHEWABLE_TABLET | Freq: Three times a day (TID) | ORAL | Status: DC
  Administered 2021-02-12 – 2021-02-13 (×5): 80 mg via ORAL
  Filled 2021-02-11 (×5): qty 1

## 2021-02-11 MED ORDER — PROMETHAZINE HCL 25 MG/ML IJ SOLN: 6.2500 mg | INTRAMUSCULAR | Status: DC | PRN

## 2021-02-11 MED ORDER — COCONUT OIL OIL
1.0000 "application " | TOPICAL_OIL | Status: DC | PRN
  Administered 2021-02-12: 1 via TOPICAL

## 2021-02-11 MED ORDER — METOCLOPRAMIDE HCL 5 MG/ML IJ SOLN
INTRAMUSCULAR | Status: DC | PRN
  Administered 2021-02-11: 5 mg via INTRAVENOUS

## 2021-02-11 MED ORDER — PRENATAL MULTIVITAMIN CH
1.0000 | ORAL_TABLET | Freq: Every day | ORAL | Status: DC
  Administered 2021-02-12 – 2021-02-13 (×2): 1 via ORAL
  Filled 2021-02-11 (×2): qty 1

## 2021-02-11 MED ORDER — NIFEDIPINE ER OSMOTIC RELEASE 30 MG PO TB24
30.0000 mg | ORAL_TABLET | Freq: Every day | ORAL | Status: DC
  Administered 2021-02-11: 30 mg via ORAL
  Filled 2021-02-11: qty 1

## 2021-02-11 MED ORDER — SCOPOLAMINE 1 MG/3DAYS TD PT72
MEDICATED_PATCH | TRANSDERMAL | Status: AC
  Filled 2021-02-11: qty 1

## 2021-02-11 MED ORDER — HYDRALAZINE HCL 20 MG/ML IJ SOLN: 10.0000 mg | INTRAMUSCULAR | Status: DC | PRN

## 2021-02-11 MED ORDER — ACETAMINOPHEN 325 MG PO TABS
650.0000 mg | ORAL_TABLET | ORAL | Status: DC | PRN
  Filled 2021-02-11: qty 2

## 2021-02-11 MED ORDER — ACETAMINOPHEN 10 MG/ML IV SOLN: 1000.0000 mg | Freq: Once | INTRAVENOUS | Status: DC | PRN

## 2021-02-11 MED ORDER — OXYTOCIN-SODIUM CHLORIDE 30-0.9 UT/500ML-% IV SOLN: 2.5000 [IU]/h | INTRAVENOUS | Status: AC

## 2021-02-11 MED ORDER — FENTANYL-BUPIVACAINE-NACL 0.5-0.125-0.9 MG/250ML-% EP SOLN
12.0000 mL/h | EPIDURAL | Status: DC | PRN
  Administered 2021-02-11: 12 mL/h via EPIDURAL
  Filled 2021-02-11: qty 250

## 2021-02-11 MED ORDER — DEXAMETHASONE SODIUM PHOSPHATE 10 MG/ML IJ SOLN
INTRAMUSCULAR | Status: DC | PRN
  Administered 2021-02-11: 10 mg via INTRAVENOUS

## 2021-02-11 MED ORDER — ONDANSETRON HCL 4 MG/2ML IJ SOLN
4.0000 mg | Freq: Three times a day (TID) | INTRAMUSCULAR | Status: DC | PRN
  Administered 2021-02-12: 01:00:00 4 mg via INTRAVENOUS
  Filled 2021-02-11: qty 2

## 2021-02-11 MED ORDER — LABETALOL HCL 5 MG/ML IV SOLN
20.0000 mg | INTRAVENOUS | Status: DC | PRN
  Administered 2021-02-11 (×3): 20 mg via INTRAVENOUS
  Filled 2021-02-11 (×3): qty 4

## 2021-02-11 MED ORDER — SIMETHICONE 80 MG PO CHEW: 80.0000 mg | CHEWABLE_TABLET | ORAL | Status: DC | PRN

## 2021-02-11 MED ORDER — METHYLERGONOVINE MALEATE 0.2 MG/ML IJ SOLN: 0.2000 mg | INTRAMUSCULAR | Status: DC | PRN

## 2021-02-11 MED ORDER — LACTATED RINGERS IV SOLN: INTRAVENOUS | Status: DC | PRN

## 2021-02-11 MED ORDER — ONDANSETRON HCL 4 MG/2ML IJ SOLN
INTRAMUSCULAR | Status: AC
  Filled 2021-02-11: qty 4

## 2021-02-11 MED ORDER — LABETALOL HCL 5 MG/ML IV SOLN: 80.0000 mg | INTRAVENOUS | Status: DC | PRN

## 2021-02-11 MED ORDER — EPHEDRINE 5 MG/ML INJ: 10.0000 mg | INTRAVENOUS | Status: DC | PRN

## 2021-02-11 MED ORDER — ACETAMINOPHEN 500 MG PO TABS: 1000.0000 mg | ORAL_TABLET | Freq: Four times a day (QID) | ORAL | Status: DC

## 2021-02-11 MED ORDER — SOD CITRATE-CITRIC ACID 500-334 MG/5ML PO SOLN
30.0000 mL | ORAL | Status: DC | PRN
  Administered 2021-02-11: 30 mL via ORAL
  Filled 2021-02-11: qty 30

## 2021-02-11 MED ORDER — METHYLERGONOVINE MALEATE 0.2 MG PO TABS: 0.2000 mg | ORAL_TABLET | ORAL | Status: DC | PRN

## 2021-02-11 MED ORDER — IBUPROFEN 600 MG PO TABS
600.0000 mg | ORAL_TABLET | Freq: Four times a day (QID) | ORAL | Status: DC
  Administered 2021-02-12 – 2021-02-13 (×3): 600 mg via ORAL
  Filled 2021-02-11 (×3): qty 1

## 2021-02-11 MED ORDER — OXYTOCIN-SODIUM CHLORIDE 30-0.9 UT/500ML-% IV SOLN
1.0000 m[IU]/min | INTRAVENOUS | Status: DC
  Administered 2021-02-11: 2 m[IU]/min via INTRAVENOUS
  Filled 2021-02-11 (×2): qty 500

## 2021-02-11 MED ORDER — DIBUCAINE (PERIANAL) 1 % EX OINT: 1.0000 "application " | TOPICAL_OINTMENT | CUTANEOUS | Status: DC | PRN

## 2021-02-11 MED ORDER — MENTHOL 3 MG MT LOZG: 1.0000 | LOZENGE | OROMUCOSAL | Status: DC | PRN

## 2021-02-11 MED ORDER — FENTANYL CITRATE (PF) 100 MCG/2ML IJ SOLN
INTRAMUSCULAR | Status: AC
  Filled 2021-02-11: qty 2

## 2021-02-11 MED ORDER — MISOPROSTOL 25 MCG QUARTER TABLET
25.0000 ug | ORAL_TABLET | ORAL | Status: DC | PRN
  Administered 2021-02-11 (×2): 25 ug via VAGINAL
  Filled 2021-02-11 (×2): qty 1

## 2021-02-11 MED ORDER — TERBUTALINE SULFATE 1 MG/ML IJ SOLN: 0.2500 mg | Freq: Once | INTRAMUSCULAR | Status: DC | PRN

## 2021-02-11 MED ORDER — METOCLOPRAMIDE HCL 5 MG/ML IJ SOLN
INTRAMUSCULAR | Status: AC
  Filled 2021-02-11: qty 2

## 2021-02-11 MED ORDER — CEFAZOLIN SODIUM-DEXTROSE 2-4 GM/100ML-% IV SOLN
INTRAVENOUS | Status: AC
  Filled 2021-02-11: qty 100

## 2021-02-11 MED ORDER — LIDOCAINE-EPINEPHRINE (PF) 2 %-1:200000 IJ SOLN
INTRAMUSCULAR | Status: AC
  Filled 2021-02-11: qty 20

## 2021-02-11 MED ORDER — BUPIVACAINE HCL (PF) 0.25 % IJ SOLN
INTRAMUSCULAR | Status: AC
  Filled 2021-02-11: qty 20

## 2021-02-11 MED ORDER — LACTATED RINGERS IV SOLN: 500.0000 mL | INTRAVENOUS | Status: DC | PRN

## 2021-02-11 MED ORDER — ONDANSETRON HCL 4 MG/2ML IJ SOLN
INTRAMUSCULAR | Status: DC | PRN
  Administered 2021-02-11: 4 mg via INTRAVENOUS

## 2021-02-11 MED ORDER — LACTATED RINGERS IV SOLN
500.0000 mL | Freq: Once | INTRAVENOUS | Status: AC
  Administered 2021-02-11: 500 mL via INTRAVENOUS

## 2021-02-11 MED ORDER — NALOXONE HCL 0.4 MG/ML IJ SOLN: 0.4000 mg | INTRAMUSCULAR | Status: DC | PRN

## 2021-02-11 MED ORDER — LABETALOL HCL 5 MG/ML IV SOLN: 20.0000 mg | INTRAVENOUS | Status: DC | PRN

## 2021-02-11 MED ORDER — MEPERIDINE HCL 25 MG/ML IJ SOLN: 6.2500 mg | INTRAMUSCULAR | Status: DC | PRN

## 2021-02-11 MED ORDER — LABETALOL HCL 5 MG/ML IV SOLN: 40.0000 mg | INTRAVENOUS | Status: DC | PRN

## 2021-02-11 MED ORDER — LACTATED RINGERS IV SOLN: INTRAVENOUS | Status: DC

## 2021-02-11 MED ORDER — SODIUM CHLORIDE 0.9 % IR SOLN
Status: DC | PRN
  Administered 2021-02-11: 1

## 2021-02-11 MED ORDER — MORPHINE SULFATE (PF) 0.5 MG/ML IJ SOLN
INTRAMUSCULAR | Status: AC
  Filled 2021-02-11: qty 10

## 2021-02-11 MED ORDER — OXYCODONE HCL 5 MG/5ML PO SOLN: 5.0000 mg | Freq: Once | ORAL | Status: DC | PRN

## 2021-02-11 MED ORDER — DIPHENHYDRAMINE HCL 50 MG/ML IJ SOLN: 12.5000 mg | INTRAMUSCULAR | Status: DC | PRN

## 2021-02-11 MED ORDER — SCOPOLAMINE 1 MG/3DAYS TD PT72
1.0000 | MEDICATED_PATCH | Freq: Once | TRANSDERMAL | Status: DC
  Administered 2021-02-11: 1.5 mg via TRANSDERMAL

## 2021-02-11 MED ORDER — ZOLPIDEM TARTRATE 5 MG PO TABS: 5.0000 mg | ORAL_TABLET | Freq: Every evening | ORAL | Status: DC | PRN

## 2021-02-11 MED ORDER — DIPHENHYDRAMINE HCL 50 MG/ML IJ SOLN: 12.5000 mg | Freq: Four times a day (QID) | INTRAMUSCULAR | Status: DC | PRN

## 2021-02-11 MED ORDER — OXYTOCIN-SODIUM CHLORIDE 30-0.9 UT/500ML-% IV SOLN
INTRAVENOUS | Status: DC | PRN
  Administered 2021-02-11: 400 mL via INTRAVENOUS

## 2021-02-11 MED ORDER — PHENYLEPHRINE 40 MCG/ML (10ML) SYRINGE FOR IV PUSH (FOR BLOOD PRESSURE SUPPORT)
80.0000 ug | PREFILLED_SYRINGE | INTRAVENOUS | Status: DC | PRN
  Filled 2021-02-11: qty 10

## 2021-02-11 MED ORDER — DEXAMETHASONE SODIUM PHOSPHATE 10 MG/ML IJ SOLN
INTRAMUSCULAR | Status: AC
  Filled 2021-02-11: qty 1

## 2021-02-11 MED ORDER — LABETALOL HCL 5 MG/ML IV SOLN
40.0000 mg | INTRAVENOUS | Status: DC | PRN
  Administered 2021-02-11: 40 mg via INTRAVENOUS
  Filled 2021-02-11: qty 8

## 2021-02-11 MED ORDER — ONDANSETRON HCL 4 MG/2ML IJ SOLN
4.0000 mg | Freq: Four times a day (QID) | INTRAMUSCULAR | Status: DC | PRN
  Administered 2021-02-11: 4 mg via INTRAVENOUS
  Filled 2021-02-11: qty 2

## 2021-02-11 MED ORDER — OXYTOCIN-SODIUM CHLORIDE 30-0.9 UT/500ML-% IV SOLN: 2.5000 [IU]/h | INTRAVENOUS | Status: DC

## 2021-02-11 MED ORDER — MAGNESIUM SULFATE 40 GM/1000ML IV SOLN
1.0000 g/h | INTRAVENOUS | Status: AC
  Administered 2021-02-11: 1 g/h via INTRAVENOUS
  Filled 2021-02-11: qty 1000

## 2021-02-11 MED ORDER — PHENYLEPHRINE 40 MCG/ML (10ML) SYRINGE FOR IV PUSH (FOR BLOOD PRESSURE SUPPORT): 80.0000 ug | PREFILLED_SYRINGE | INTRAVENOUS | Status: DC | PRN

## 2021-02-11 MED ORDER — BUPIVACAINE HCL (PF) 0.25 % IJ SOLN
INTRAMUSCULAR | Status: DC | PRN
  Administered 2021-02-11: 20 mL

## 2021-02-11 MED ORDER — MAGNESIUM SULFATE 40 GM/1000ML IV SOLN: 1.0000 g/h | INTRAVENOUS | Status: DC

## 2021-02-11 MED ORDER — FENTANYL CITRATE (PF) 100 MCG/2ML IJ SOLN
INTRAMUSCULAR | Status: DC | PRN
  Administered 2021-02-11: 100 ug via EPIDURAL

## 2021-02-11 MED ORDER — OXYCODONE HCL 5 MG PO TABS: 5.0000 mg | ORAL_TABLET | ORAL | Status: DC | PRN

## 2021-02-11 MED ORDER — KETOROLAC TROMETHAMINE 30 MG/ML IJ SOLN
30.0000 mg | Freq: Once | INTRAMUSCULAR | Status: AC
  Administered 2021-02-11: 30 mg via INTRAVENOUS

## 2021-02-11 MED ORDER — KETOROLAC TROMETHAMINE 30 MG/ML IJ SOLN
INTRAMUSCULAR | Status: AC
  Filled 2021-02-11: qty 1

## 2021-02-11 MED ORDER — FENTANYL CITRATE (PF) 100 MCG/2ML IJ SOLN: 100.0000 ug | INTRAMUSCULAR | Status: DC | PRN

## 2021-02-11 MED ORDER — CHLOROPROCAINE HCL (PF) 3 % IJ SOLN
INTRAMUSCULAR | Status: DC | PRN
  Administered 2021-02-11: 20 mL

## 2021-02-11 MED ORDER — NALOXONE HCL 4 MG/10ML IJ SOLN
1.0000 ug/kg/h | INTRAVENOUS | Status: DC | PRN
  Filled 2021-02-11: qty 5

## 2021-02-11 MED ORDER — LIDOCAINE-EPINEPHRINE (PF) 2 %-1:200000 IJ SOLN
INTRAMUSCULAR | Status: DC | PRN
Start: 2021-02-11 — End: 2021-02-11
  Administered 2021-02-11: 3 mL via EPIDURAL
  Administered 2021-02-11: 2 mL via EPIDURAL
  Administered 2021-02-11: 5 mL via EPIDURAL
  Administered 2021-02-11: 3 mL via EPIDURAL

## 2021-02-11 MED ORDER — DIPHENHYDRAMINE HCL 25 MG PO CAPS: 25.0000 mg | ORAL_CAPSULE | ORAL | Status: DC | PRN

## 2021-02-11 MED ORDER — DIPHENHYDRAMINE HCL 25 MG PO CAPS: 25.0000 mg | ORAL_CAPSULE | Freq: Four times a day (QID) | ORAL | Status: DC | PRN

## 2021-02-11 MED ORDER — CHLOROPROCAINE HCL (PF) 3 % IJ SOLN
INTRAMUSCULAR | Status: AC
  Filled 2021-02-11: qty 20

## 2021-02-11 MED ORDER — OXYCODONE HCL 5 MG PO TABS: 5.0000 mg | ORAL_TABLET | Freq: Once | ORAL | Status: DC | PRN

## 2021-02-11 MED ORDER — MAGNESIUM SULFATE BOLUS VIA INFUSION
4.0000 g | Freq: Once | INTRAVENOUS | Status: AC
  Filled 2021-02-11: qty 1000

## 2021-02-11 MED ORDER — WITCH HAZEL-GLYCERIN EX PADS: 1.0000 "application " | MEDICATED_PAD | CUTANEOUS | Status: DC | PRN

## 2021-02-11 MED ORDER — LIDOCAINE HCL (PF) 1 % IJ SOLN: 30.0000 mL | INTRAMUSCULAR | Status: DC | PRN

## 2021-02-11 SURGICAL SUPPLY — 38 items
BENZOIN TINCTURE PRP APPL 2/3 (GAUZE/BANDAGES/DRESSINGS) ×1 IMPLANT
CHLORAPREP W/TINT 26ML (MISCELLANEOUS) ×2 IMPLANT
CLAMP CORD UMBIL (MISCELLANEOUS) IMPLANT
CLOSURE STERI STRIP 1/2 X4 (GAUZE/BANDAGES/DRESSINGS) ×1 IMPLANT
CLOTH BEACON ORANGE TIMEOUT ST (SAFETY) ×2 IMPLANT
DRSG OPSITE POSTOP 4X10 (GAUZE/BANDAGES/DRESSINGS) ×2 IMPLANT
ELECT REM PT RETURN 9FT ADLT (ELECTROSURGICAL) ×2
ELECTRODE REM PT RTRN 9FT ADLT (ELECTROSURGICAL) ×1 IMPLANT
EXTRACTOR VACUUM KIWI (MISCELLANEOUS) IMPLANT
GAUZE SPONGE 4X4 12PLY STRL LF (GAUZE/BANDAGES/DRESSINGS) ×2 IMPLANT
GLOVE BIOGEL PI IND STRL 7.0 (GLOVE) ×1 IMPLANT
GLOVE BIOGEL PI INDICATOR 7.0 (GLOVE) ×1
GLOVE SURG LTX SZ8 (GLOVE) ×2 IMPLANT
GOWN STRL REUS W/TWL LRG LVL3 (GOWN DISPOSABLE) ×4 IMPLANT
KIT ABG SYR 3ML LUER SLIP (SYRINGE) IMPLANT
NDL HYPO 25X5/8 SAFETYGLIDE (NEEDLE) IMPLANT
NDL SPNL 20GX3.5 QUINCKE YW (NEEDLE) IMPLANT
NEEDLE HYPO 22GX1.5 SAFETY (NEEDLE) ×2 IMPLANT
NEEDLE HYPO 25X5/8 SAFETYGLIDE (NEEDLE) IMPLANT
NEEDLE SPNL 20GX3.5 QUINCKE YW (NEEDLE) IMPLANT
NS IRRIG 1000ML POUR BTL (IV SOLUTION) ×2 IMPLANT
PACK C SECTION WH (CUSTOM PROCEDURE TRAY) ×2 IMPLANT
PAD ABD 7.5X8 STRL (GAUZE/BANDAGES/DRESSINGS) ×1 IMPLANT
PENCIL SMOKE EVAC W/HOLSTER (ELECTROSURGICAL) ×2 IMPLANT
SUT MNCRL 0 VIOLET CTX 36 (SUTURE) ×2 IMPLANT
SUT MNCRL AB 3-0 PS2 27 (SUTURE) ×1 IMPLANT
SUT MON AB 2-0 CT1 27 (SUTURE) ×2 IMPLANT
SUT MON AB-0 CT1 36 (SUTURE) ×4 IMPLANT
SUT MONOCRYL 0 CTX 36 (SUTURE) ×2
SUT PLAIN 0 NONE (SUTURE) IMPLANT
SUT PLAIN 2 0 (SUTURE)
SUT PLAIN 2 0 XLH (SUTURE) IMPLANT
SUT PLAIN ABS 2-0 CT1 27XMFL (SUTURE) IMPLANT
SYR 20CC LL (SYRINGE) IMPLANT
SYR CONTROL 10ML LL (SYRINGE) ×2 IMPLANT
TOWEL OR 17X24 6PK STRL BLUE (TOWEL DISPOSABLE) ×2 IMPLANT
TRAY FOLEY W/BAG SLVR 14FR LF (SET/KITS/TRAYS/PACK) ×2 IMPLANT
WATER STERILE IRR 1000ML POUR (IV SOLUTION) ×2 IMPLANT

## 2021-02-11 NOTE — Progress Notes (Signed)
S: Comfortable with epidural. Wife, Bethany Singleton, at the bedside providing support. Discussed IUPC placement for contraction monitoring and patient consents to procedure. Eagerly anticipating baby boy "Bethany Singleton".   O: Vitals:   02/11/21 1401 02/11/21 1431 02/11/21 1501 02/11/21 1531  BP: 136/86 133/88 125/87 (!) 106/59  Pulse: 86 84 85 84  Resp: 16 16 16    Temp:   98.2 F (36.8 C)   TempSrc:   Oral   Weight:      Height:       FHT:  FHR: 120 bpm, variability: moderate,  accelerations:  Present,  decelerations:  Absent UC:   regular, every 3-5 minutes SVE:   Dilation: 5 Effacement (%): 70 Station: -3 Exam by:: Bethany Singleton, CNM  IUPC placed without difficulty  A / P: Induction of labor due to preeclampsia,  progressing well on pitocin  Fetal Wellbeing:  Category I PEC: BPs mild range, magnesium infusing at modified maintenance dose due to elevated creatinine, labs stable Pain Control:  Epidural Anticipated MOD:  Guarded  Dr. [phone] updated on patient status and plan of care.   Bethany Singleton, CNM, MSN 02/11/2021, 3:58 PM

## 2021-02-11 NOTE — Progress Notes (Signed)
Bethany Singleton is a 30 y.o. G1P0 at [redacted]w[redacted]d by LMP admitted for induction of labor due to Pre-eclamptic toxemia of pregnancy..  Subjective: Comfortable with epidural  Objective: BP 130/89    Pulse 86    Temp 98.2 F (36.8 C) (Oral)    Resp 16    Ht 5\' 4"  (1.626 m)    Wt 101.4 kg    BMI 38.38 kg/m  No intake/output data recorded. Total I/O In: 2485.3 [P.O.:761; I.V.:1664.1; Other:60.2] Out: 2550 [Urine:2550]  FHT:  FHR: 145 bpm, variability: moderate,  accelerations:  Present,  decelerations:  Absent UC:  regular q 120 MVU VE : 5/80/-3/ROT asynclitic  Labs: Lab Results  Component Value Date   WBC 9.6 02/11/2021   HGB 10.5 (L) 02/11/2021   HCT 32.3 (L) 02/11/2021   MCV 87.8 02/11/2021   PLT 175 02/11/2021    Assessment / Plan: PEC now with severe features by BP criteria GDM Stable Polyhydramnios Elevated creatinine- modified Magnesium maintenance dose Active phase arrest- No change in VE x 8h on Pitocin  Labor: arrest of dilatation Preeclampsia:  on magnesium sulfate, no signs or symptoms of toxicity, intake and ouput balanced, and labs stable Fetal Wellbeing:  Category I Pain Control:  epidural I/D:  n/a Anticipated MOD:  Proceed with csection . Consent done. Risks vs benefits of surgery vs expectant management discussed.   Amous Crewe J 02/11/2021, 6:04 PM

## 2021-02-11 NOTE — H&P (Signed)
Bethany Singleton is a 30 y.o. female presenting for IOL for PEC without severe features. OB History     Gravida  1   Para      Term      Preterm      AB      Living         SAB      IAB      Ectopic      Multiple      Live Births             Past Medical History:  Diagnosis Date   Anxiety    Past Surgical History:  Procedure Laterality Date   COLONOSCOPY WITH PROPOFOL N/A 12/13/2018   Procedure: COLONOSCOPY WITH PROPOFOL;  Surgeon: Bethany Bali, MD;  Location: [facility];  Service: Endoscopy;  Laterality: N/A;  12:00pm   lymph node removal from behind R ear     Family History: family history is not on file. Social History:  reports that she has never smoked. She has never used smokeless tobacco. She reports current alcohol use. She reports that she does not use drugs.     Maternal Diabetes: Yes:  Diabetes Type:  Diet controlled Genetic Screening: Normal Maternal Ultrasounds/Referrals: Normal Fetal Ultrasounds or other Referrals:  None Maternal Substance Abuse:  No Significant Maternal Medications:  None Significant Maternal Lab Results:  Group B Strep negative Other Comments:  None  Review of Systems  Constitutional: Negative.   All other systems reviewed and are negative. Maternal Medical History:  Contractions: Perceived severity is mild.   Fetal activity: Perceived fetal activity is normal.   Prenatal complications: Polyhydramnios and pre-eclampsia.   Prenatal Complications - Diabetes: gestational.  Dilation: 1.5 Effacement (%): Thick Station: Ballotable Exam by:: Bethany Singleton Blood pressure (!) 154/95, pulse 82, temperature 98.2 F (36.8 C), temperature source Oral, resp. rate 16, height 5\' 4"  (1.626 m), weight 101.4 kg. Maternal Exam:  Uterine Assessment: Contraction strength is mild.  Contraction frequency is rare.  Abdomen: Patient reports no abdominal tenderness. Introitus: Normal vulva. Normal vagina.  Ferning test: not  done.  Nitrazine test: not done. Amniotic fluid character: not assessed. Pelvis: adequate for delivery.   Cervix: Cervix evaluated by digital exam.    Physical Exam Constitutional:      Appearance: Normal appearance. She is normal weight.  HENT:     Head: Normocephalic and atraumatic.  Cardiovascular:     Rate and Rhythm: Normal rate and regular rhythm.  Pulmonary:     Effort: Pulmonary effort is normal.     Breath sounds: Normal breath sounds.  Abdominal:     General: Bowel sounds are normal.     Palpations: Abdomen is soft.  Genitourinary:    General: Normal vulva.  Musculoskeletal:        General: Normal range of motion.     Cervical back: Normal range of motion and neck supple.  Skin:    General: Skin is warm and dry.  Neurological:     General: No focal deficit present.     Mental Status: She is alert.  Psychiatric:        Mood and Affect: Mood normal.        Behavior: Behavior normal.    Prenatal labs: ABO, Rh: --/--/O POS (12/13 0036) Antibody: NEG (12/13 0036) Rubella: Immune (06/14 0000) RPR: Nonreactive (06/14 0000)  HBsAg: Negative (06/14 0000)  HIV: Non-reactive (06/29 0000)  GBS:   neg  Assessment/Plan: 37wk IUP PEC  without severe features IOL Ck labs Cytotec   Bethany Singleton J 02/11/2021, 6:33 AM

## 2021-02-11 NOTE — Op Note (Signed)
Cesarean Section Procedure Note  Indications: failure to progress: arrest of dilation  Pre-operative Diagnosis: 37 week 2 day pregnancy.  Post-operative Diagnosis: same  Surgeon: Lenoard Aden   Assistants: Yetta Barre, CNM  Anesthesia: Epidural anesthesia and Local anesthesia 0.25.% bupivacaine  ASA Class: 2  Procedure Details  The patient was seen in the Holding Room. The risks, benefits, complications, treatment options, and expected outcomes were discussed with the patient.  The patient concurred with the proposed plan, giving informed consent. The risks of anesthesia, infection, bleeding and possible injury to other organs discussed. Injury to bowel, bladder, or ureter with possible need for repair discussed. Possible need for transfusion with secondary risks of hepatitis or HIV acquisition discussed. Post operative complications to include but not limited to DVT, PE and Pneumonia noted. The site of surgery properly noted/marked. The patient was taken to Operating Room # C, identified as Phineas Real and the procedure verified as C-Section Delivery. A Time Out was held and the above information confirmed.  After induction of anesthesia, the patient was draped and prepped in the usual sterile manner. A Pfannenstiel incision was made and carried down through the subcutaneous tissue to the fascia. Fascial incision was made and extended transversely using Mayo scissors. The fascia was separated from the underlying rectus tissue superiorly and inferiorly. The peritoneum was identified and entered. Peritoneal incision was extended longitudinally. The utero-vesical peritoneal reflection was incised transversely and the bladder flap was bluntly freed from the lower uterine segment. A low transverse uterine incision(Kerr hysterotomy) was made. Delivered from OT asynclitic partial brow presentation was a  female with Apgar scores of 8 at one minute and 9 at five minutes. Bulb suctioning gently  performed. Neonatal team in attendance.After the umbilical cord was clamped and cut cord blood was obtained for evaluation. The placenta was removed intact and appeared normal. The uterus was curetted with a dry lap pack. Good hemostasis was noted.The uterine outline, tubes and ovaries appeared normal. The uterine incision was closed with running locked sutures of 0 Monocryl x 2 layers. Hemostasis was observed.The parietal peritoneum was closed with a running 2-0 Monocryl suture. The fascia was then reapproximated with running sutures of 0 Monocryl. The skin was reapproximated with 4-0 vicryl.  Instrument, sponge, and needle counts were correct prior the abdominal closure and at the conclusion of the case.   Findings: As above, post placenta  Estimated Blood Loss:  300 mL         Drains: foley                 Specimens: placenta                 Complications:  None; patient tolerated the procedure well.         Disposition: PACU - hemodynamically stable.         Condition: stable  Attending Attestation: I performed the procedure.

## 2021-02-11 NOTE — Transfer of Care (Signed)
Immediate Anesthesia Transfer of Care Note  Patient: Bethany Singleton  Procedure(s) Performed: CESAREAN SECTION  Patient Location: PACU  Anesthesia Type:Epidural  Level of Consciousness: awake  Airway & Oxygen Therapy: Patient Spontanous Breathing  Post-op Assessment: Report given to RN and Post -op Vital signs reviewed and stable  Post vital signs: Reviewed and stable  Last Vitals:  Vitals Value Taken Time  BP 106/70 02/11/21 1923  Temp    Pulse 100 02/11/21 1926  Resp 15 02/11/21 1926  SpO2 95 % 02/11/21 1926  Vitals shown include unvalidated device data.  Last Pain:  Vitals:   02/11/21 1801  TempSrc: Oral  PainSc:          Complications: No notable events documented.

## 2021-02-11 NOTE — Anesthesia Procedure Notes (Signed)
Epidural Patient location during procedure: OB Start time: 02/11/2021 11:55 AM End time: 02/11/2021 12:04 PM  Staffing Anesthesiologist: Mal Amabile, MD Performed: anesthesiologist   Preanesthetic Checklist Completed: patient identified, IV checked, site marked, risks and benefits discussed, surgical consent, monitors and equipment checked, pre-op evaluation and timeout performed  Epidural Patient position: sitting Prep: DuraPrep and site prepped and draped Patient monitoring: continuous pulse ox and blood pressure Approach: midline Location: L3-L4 Injection technique: LOR air  Needle:  Needle type: Tuohy  Needle gauge: 17 G Needle length: 9 cm and 9 Needle insertion depth: 4 cm Catheter type: closed end flexible Catheter size: 19 Gauge Catheter at skin depth: 10 cm Test dose: negative and Other  Assessment Events: blood not aspirated, injection not painful, no injection resistance, no paresthesia and negative IV test  Additional Notes Patient identified. Risks and benefits discussed including failed block, incomplete  Pain control, post dural puncture headache, nerve damage, paralysis, blood pressure Changes, nausea, vomiting, reactions to medications-both toxic and allergic and post Partum back pain. All questions were answered. Patient expressed understanding and wished to proceed. Sterile technique was used throughout procedure. Epidural site was Dressed with sterile barrier dressing. No paresthesias, signs of intravascular injection Or signs of intrathecal spread were encountered.  Patient was more comfortable after the epidural was dosed. Please see RN's note for documentation of vital signs and FHR which are stable.

## 2021-02-11 NOTE — Anesthesia Preprocedure Evaluation (Signed)
Anesthesia Evaluation  Patient identified by MRN, date of birth, ID band Patient awake    Reviewed: Allergy & Precautions, Patient's Chart, lab work & pertinent test results, reviewed documented beta blocker date and time   Airway Mallampati: II  TM Distance: >3 FB Neck ROM: Full    Dental no notable dental hx.    Pulmonary neg pulmonary ROS,    Pulmonary exam normal breath sounds clear to auscultation       Cardiovascular hypertension, Pt. on medications Normal cardiovascular exam Rhythm:Regular Rate:Normal     Neuro/Psych Anxiety negative neurological ROS     GI/Hepatic Neg liver ROS, GERD  ,  Endo/Other  diabetes, Well Controlled, Gestational, Oral Hypoglycemic AgentsMorbid obesity  Renal/GU negative Renal ROS  negative genitourinary   Musculoskeletal negative musculoskeletal ROS (+)   Abdominal (+) + obese,   Peds  Hematology  (+) anemia ,   Anesthesia Other Findings   Reproductive/Obstetrics (+) Pregnancy GDM Pre eclampsia with severe features                             Anesthesia Physical Anesthesia Plan  ASA: 3  Anesthesia Plan: Epidural   Post-op Pain Management:    Induction:   PONV Risk Score and Plan:   Airway Management Planned: Natural Airway  Additional Equipment:   Intra-op Plan:   Post-operative Plan:   Informed Consent: I have reviewed the patients History and Physical, chart, labs and discussed the procedure including the risks, benefits and alternatives for the proposed anesthesia with the patient or authorized representative who has indicated his/her understanding and acceptance.       Plan Discussed with: Anesthesiologist  Anesthesia Plan Comments:         Anesthesia Quick Evaluation

## 2021-02-11 NOTE — Progress Notes (Signed)
Bethany Singleton is a 30 y.o. G1P0 at [redacted]w[redacted]d by LMP admitted for induction of labor due to Pre-eclamptic toxemia of pregnancy..  Subjective: Feels flushed after magnesium  Objective: BP (!) 152/82   Pulse 84   Temp 97.9 F (36.6 C) (Oral)   Resp 16   Ht 5\' 4"  (1.626 m)   Wt 101.4 kg   BMI 38.38 kg/m  No intake/output data recorded. No intake/output data recorded.  FHT:  FHR: 145 bpm, variability: moderate,  accelerations:  Present,  decelerations:  Absent UC:   irregular, every 8 minutes SVE:   2/60/-2 AROM- clear  Labs: Lab Results  Component Value Date   WBC 9.7 02/11/2021   HGB 9.9 (L) 02/11/2021   HCT 30.4 (L) 02/11/2021   MCV 87.1 02/11/2021   PLT 158 02/11/2021    Assessment / Plan: PEC now with severe features by BP criteria GDM Stable Polyhydramnios Elevated creatinine- modified Magnesium maintenance dose Will reck CMP and mag level in 4h  Labor: Progressing normally - start Pitocin 4h post cytotec Preeclampsia:  on magnesium sulfate, no signs or symptoms of toxicity, intake and ouput balanced, and labs stable Fetal Wellbeing:  Category I Pain Control:  Labor support without medications I/D:  n/a Anticipated MOD:   guarded  Jaton Eilers J 02/11/2021, 8:24 AM

## 2021-02-11 NOTE — Lactation Note (Signed)
This note was copied from a baby's chart. Lactation Consultation Note Mom holding baby STS and covered. Baby has low temp. Mom GDM so LC going to try to get baby to feed.  Mom has generalized edema to entire body. Breast are full feeling. Hand expression w/easily expressed colostrum. LC hand expressed a few drops to soften breast before latching to get nipples to evert better. In football position baby latched great. Mom denied painful latch. Baby BF well for 20 minutes. Noted softening in breast compared to opposite breast not BF on.  LC taught hand expression collecting 5 ml colostrum. Significant other held baby STS while hand expressing. Mom very sleepy. Baby fussy. LC spoon fed colostrum.  Mom encouraged to feed baby 8-12 times/24 hours and with feeding cues.   Mom sleeping, LC set up DEBP. Mom knows to pump q3h for 15-20 min. Discussed pumping w/mom before she fell asleep. Mom asked to pump later so she can rest.  Gave mom supplementation paper and reviewed importance of supplementing since baby is 6.lbs and mom is GDM. Mom states understanding. Mom prefers at this time to give her BM and not to have to give a bottle at this time. Mom really wants to BF.  Of course stated she would do what ever is needed to feed her baby.  Explained to mom we collected good colostrum and baby took well in spoon at this time. Situation will be evaluated as needed. Milk storage reviewed as well as newborn feeding habits.  Lactation brochure given.  Patient Name: Bethany Singleton [id number] Date: 02/11/2021 Reason for consult: Initial assessment;Primapara;Early term 37-38.6wks;Maternal endocrine disorder Age:64 hours  Maternal Data Has patient been taught Hand Expression?: Yes Does the patient have breastfeeding experience prior to this delivery?: No  Feeding    LATCH Score Latch: Grasps breast easily, tongue down, lips flanged, rhythmical sucking.  Audible Swallowing: A few with  stimulation  Type of Nipple: Flat  Comfort (Breast/Nipple): Filling, red/small blisters or bruises, mild/mod discomfort (breast full feeling/generalized edema to body)  Hold (Positioning): Assistance needed to correctly position infant at breast and maintain latch.  LATCH Score: 8   Lactation Tools Discussed/Used Tools: Shells;Pump Breast pump type: Double-Electric Breast Pump Pump Education:  (mom sleepy wanting to rest pump later) Reason for Pumping: 6 lbs baby, supplementation  Interventions Interventions: Breast feeding basics reviewed;Adjust position;DEBP;Assisted with latch;Support pillows;Skin to skin;Position options;Breast massage;Expressed milk;Education;Hand express;Pre-pump if needed;Shells;Breast compression  Discharge WIC Program: No  Consult Status Consult Status: Follow-up Date: 02/12/21 Follow-up type: In-patient    Charyl Dancer 02/11/2021, 10:23 PM

## 2021-02-12 DIAGNOSIS — R609 Edema, unspecified: Secondary | ICD-10-CM | POA: Diagnosis present

## 2021-02-12 LAB — CBC
HCT: 24.5 % — ABNORMAL LOW (ref 36.0–46.0)
Hemoglobin: 8.1 g/dL — ABNORMAL LOW (ref 12.0–15.0)
MCH: 28.9 pg (ref 26.0–34.0)
MCHC: 33.1 g/dL (ref 30.0–36.0)
MCV: 87.5 fL (ref 80.0–100.0)
Platelets: 152 10*3/uL (ref 150–400)
RBC: 2.8 MIL/uL — ABNORMAL LOW (ref 3.87–5.11)
RDW: 12.9 % (ref 11.5–15.5)
WBC: 17.1 10*3/uL — ABNORMAL HIGH (ref 4.0–10.5)
nRBC: 0 % (ref 0.0–0.2)

## 2021-02-12 MED ORDER — SODIUM CHLORIDE 0.9 % IV SOLN
500.0000 mg | Freq: Once | INTRAVENOUS | Status: AC
  Administered 2021-02-13: 500 mg via INTRAVENOUS
  Filled 2021-02-12 (×2): qty 25

## 2021-02-12 MED ORDER — CITALOPRAM HYDROBROMIDE 10 MG PO TABS
40.0000 mg | ORAL_TABLET | Freq: Every day | ORAL | Status: DC
  Administered 2021-02-12 – 2021-02-13 (×2): 40 mg via ORAL
  Filled 2021-02-12 (×2): qty 4

## 2021-02-12 MED ORDER — LIDOCAINE-EPINEPHRINE (PF) 1.5 %-1:200000 IJ SOLN
INTRAMUSCULAR | Status: DC | PRN
  Administered 2021-02-11: 5 mL via EPIDURAL

## 2021-02-12 MED ORDER — HYDROCHLOROTHIAZIDE 12.5 MG PO TABS
12.5000 mg | ORAL_TABLET | Freq: Every day | ORAL | Status: DC
  Administered 2021-02-12: 12:00:00 12.5 mg via ORAL
  Filled 2021-02-12: qty 1

## 2021-02-12 MED ORDER — NIFEDIPINE ER OSMOTIC RELEASE 30 MG PO TB24
30.0000 mg | ORAL_TABLET | Freq: Every day | ORAL | Status: DC
  Administered 2021-02-12 – 2021-02-13 (×2): 30 mg via ORAL
  Filled 2021-02-12 (×2): qty 1

## 2021-02-12 NOTE — Progress Notes (Signed)
Subjective: POD# 1 Live born female  Birth Weight: 6 lb 0.7 oz (2740 g) APGAR: 8, 9  Newborn Delivery   Birth date/time: 02/11/2021 18:41:00 Delivery type: C-Section, Low Transverse Trial of labor: Yes C-section categorization: Primary     Baby name: Bethany Singleton Delivering provider: Olivia Mackie   Circumcision Yes, planning  Feeding: breast and bottle  Pain control at delivery: Epidural   Reports feeling tried and sore but otherwise better since delivery. Denies PEC symptoms. Wife, Bethany Singleton, present and supportive. Working on breastfeeding with lactation.   Patient reports tolerating PO.   Pain controlled with acetaminophen and prescription NSAID's including ketorolac (Toradol) Denies HA/SOB/C/P/N/V/dizziness. She reports vaginal bleeding as normal, without clots. She is ambulating and urinating without difficulty.     Objective:  Vitals:   02/12/21 0636 02/12/21 0847 02/12/21 1122 02/12/21 1543  BP:  137/86 133/76 122/75  Pulse:  90 95 91  Resp: 17 17 18 18   Temp:  97.9 F (36.6 C) 98.2 F (36.8 C) 98.4 F (36.9 C)  TempSrc:  Oral Oral Oral  SpO2:  95% 96% 96%  Weight:      Height:         Intake/Output Summary (Last 24 hours) at 02/12/2021 1840 Last data filed at 02/12/2021 1828 Gross per 24 hour  Intake 4456.78 ml  Output 7200 ml  Net -2743.22 ml     Recent Labs    02/11/21 1951 02/12/21 0529  WBC 18.6* 17.1*  HGB 8.2* 8.1*  HCT 24.9* 24.5*  PLT 160 152    Blood type: --/--/O POS (12/13 0036)  Rubella: Immune (06/14 0000)   Vaccines:   TDaP          UTD                      COVID-19 UTD  Physical Exam:  General: alert and cooperative CV: Regular rate and rhythm Resp: clear Abdomen: soft, nontender, normal bowel sounds Incision: clean, dry, and intact Uterine Fundus: firm, below umbilicus, nontender Lochia: minimal Ext: edema 2+ non-pitting edema  Assessment/Plan: 30 y.o.   POD# 1. G1P1001                  Principal Problem:   Postpartum  care following cesarean delivery 12/13  Encourage rest when baby rests Breastfeeding support Encourage to ambulate Routine post-op care Active Problems:   Gestational diabetes mellitus (GDM), antepartum  Monitor fasting CBG in the morning  Follow-up postpartum   Preeclampsia w/o severe features  BPs mild range  Normal PEC labs with exception of elevated creatinine (1.11)  Continue Magnesium 1gm/hr until 24 hours postpartum  Repeat CBC/CMP in the AM  Denies PEC symptoms   Status post primary low transverse cesarean section - FTP 12/13   Anxiety  Stable on Celexa  Close PP F/U for mood  09-01-2004, CNM, MSN 02/12/2021, 6:40 PM

## 2021-02-12 NOTE — Progress Notes (Signed)
MOB was referred for history of depression/anxiety. * Referral screened out by Clinical Social Worker because none of the following criteria appear to apply: ~ History of anxiety/depression during this pregnancy, or of post-partum depression following prior delivery. ~ Diagnosis of anxiety and/or depression within last 3 years OR * MOB's symptoms currently being treated with medication and/or therapy. MOB has and active Rx for Celexa.  No concerns noted in OB record.   Please contact the Clinical Social Worker if needs arise, by [facility] request, or if MOB scores greater than 9/yes to question 10 on Edinburgh Postpartum Depression Screen.   Blaine Hamper, MSW, LCSW Clinical Social Work [phone]

## 2021-02-12 NOTE — Progress Notes (Signed)
Patient ID: Bethany Singleton, female   DOB: 04-23-1990, 30 y.o.   MRN: [id number] Adding Procardia 30mg  XL Adding IV Venofer 500mg  tomorrow at 8 am x 1 dose

## 2021-02-12 NOTE — Lactation Note (Signed)
This note was copied from a baby's chart. Lactation Consultation Note  Patient Name: Bethany Singleton [id number] Date: 02/12/2021 Reason for consult: Follow-up assessment;Mother's request;1st time breastfeeding;Primapara;Early term 37-38.6wks;Infant weight loss;Breastfeeding assistance (GHTN on Mg, Nifedipine and HCTZ) Age:29 hours LC assisted with latching on left breast with signs of milk transfer. Mom to offer more volume with breast compression.   LC reviewed LPTI guidelines including keeping hat on all times, keeping infant STS and total feeding under 30 min   Plan 1. To feed based on cues 8-12x 24hr period.. Mom to offer breast and look for signs of milk transfer.  2. Mom to supplement with EBM 5-10 ml after latching with slow flow nipple and pace bottle feeding 3. DEBP q 3hrs for 15 min   All questions answered at the end of the visit.  Maternal Data Has patient been taught Hand Expression?: Yes  Feeding Mother's Current Feeding Choice: Breast Milk  LATCH Score Latch: Repeated attempts needed to sustain latch, nipple held in mouth throughout feeding, stimulation needed to elicit sucking reflex.  Audible Swallowing: Spontaneous and intermittent  Type of Nipple: Flat (nipples will evert with stimulation left more than right)  Comfort (Breast/Nipple): Soft / non-tender  Hold (Positioning): Assistance needed to correctly position infant at breast and maintain latch.  LATCH Score: 7   Lactation Tools Discussed/Used Tools: Pump;Flanges Flange Size: 21 Breast pump type: Double-Electric Breast Pump Pump Education: Setup, frequency, and cleaning;Milk Storage Reason for Pumping: increase stimulation Pumping frequency: every 3 hrs for 15 min  Interventions Interventions: Breast feeding basics reviewed;Assisted with latch;Skin to skin;Breast massage;Hand express;Breast compression;Adjust position;Support pillows;Position options;Expressed milk;DEBP;Education;Pace  feeding;Visual merchandiser education  Discharge    Consult Status Consult Status: Follow-up Date: 02/13/21 Follow-up type: In-patient    Bethany Singleton  Bethany Singleton 02/12/2021, 6:43 PM

## 2021-02-12 NOTE — Anesthesia Postprocedure Evaluation (Signed)
Anesthesia Post Note  Patient: Bethany Singleton  Procedure(s) Performed: CESAREAN SECTION     Patient location during evaluation: PACU Anesthesia Type: Epidural Level of consciousness: awake Pain management: pain level controlled Vital Signs Assessment: post-procedure vital signs reviewed and stable Respiratory status: spontaneous breathing, nonlabored ventilation and respiratory function stable Cardiovascular status: stable Postop Assessment: no headache, no backache and epidural receding Anesthetic complications: no   No notable events documented.  Last Vitals:  Vitals:   02/12/21 0445 02/12/21 0547  BP:    Pulse: (!) 106   Resp:  18  Temp:    SpO2:      Last Pain:  Vitals:   02/12/21 0422  TempSrc: Oral  PainSc: 0-No pain   Pain Goal:                   Catheryn Bacon Tarra Pence

## 2021-02-13 DIAGNOSIS — O9902 Anemia complicating childbirth: Secondary | ICD-10-CM | POA: Diagnosis present

## 2021-02-13 LAB — COMPREHENSIVE METABOLIC PANEL
ALT: 11 U/L (ref 0–44)
AST: 24 U/L (ref 15–41)
Albumin: 2 g/dL — ABNORMAL LOW (ref 3.5–5.0)
Alkaline Phosphatase: 81 U/L (ref 38–126)
Anion gap: 9 (ref 5–15)
BUN: 13 mg/dL (ref 6–20)
CO2: 24 mmol/L (ref 22–32)
Calcium: 7.8 mg/dL — ABNORMAL LOW (ref 8.9–10.3)
Chloride: 103 mmol/L (ref 98–111)
Creatinine, Ser: 1.05 mg/dL — ABNORMAL HIGH (ref 0.44–1.00)
GFR, Estimated: >60 mL/min (ref >60)
Glucose, Bld: 76 mg/dL (ref 70–99)
Potassium: 3.8 mmol/L (ref 3.5–5.1)
Sodium: 136 mmol/L (ref 135–145)
Total Bilirubin: 0.4 mg/dL (ref 0.3–1.2)
Total Protein: 4.7 g/dL — ABNORMAL LOW (ref 6.5–8.1)

## 2021-02-13 LAB — CBC
HCT: 21.9 % — ABNORMAL LOW (ref 36.0–46.0)
Hemoglobin: 7.4 g/dL — ABNORMAL LOW (ref 12.0–15.0)
MCH: 29.6 pg (ref 26.0–34.0)
MCHC: 33.8 g/dL (ref 30.0–36.0)
MCV: 87.6 fL (ref 80.0–100.0)
Platelets: 175 10*3/uL (ref 150–400)
RBC: 2.5 MIL/uL — ABNORMAL LOW (ref 3.87–5.11)
RDW: 13.4 % (ref 11.5–15.5)
WBC: 13.8 10*3/uL — ABNORMAL HIGH (ref 4.0–10.5)
nRBC: 0 % (ref 0.0–0.2)

## 2021-02-13 MED ORDER — MAGNESIUM OXIDE -MG SUPPLEMENT 400 (240 MG) MG PO TABS: 400.0000 mg | ORAL_TABLET | Freq: Every day | ORAL | Status: DC

## 2021-02-13 MED ORDER — ACETAMINOPHEN 500 MG PO TABS: 1000.0000 mg | ORAL_TABLET | Freq: Four times a day (QID) | ORAL | 0 refills | Status: DC

## 2021-02-13 MED ORDER — NIFEDIPINE ER 30 MG PO TB24: 30.0000 mg | ORAL_TABLET | Freq: Every day | ORAL | 0 refills | Status: DC

## 2021-02-13 MED ORDER — POLYSACCHARIDE IRON COMPLEX 150 MG PO CAPS: 150.0000 mg | ORAL_CAPSULE | Freq: Every day | ORAL | Status: DC

## 2021-02-13 MED ORDER — IBUPROFEN 600 MG PO TABS
600.0000 mg | ORAL_TABLET | Freq: Four times a day (QID) | ORAL | 0 refills | Status: DC
Start: 2021-02-13 — End: 2022-08-18

## 2021-02-13 MED ORDER — OXYCODONE HCL 5 MG PO TABS
5.0000 mg | ORAL_TABLET | Freq: Four times a day (QID) | ORAL | 0 refills | Status: AC | PRN
Start: 2021-02-13 — End: 2021-02-18

## 2021-02-13 MED ORDER — SENNOSIDES-DOCUSATE SODIUM 8.6-50 MG PO TABS: 2.0000 | ORAL_TABLET | Freq: Every day | ORAL | Status: DC

## 2021-02-13 MED ORDER — POLYSACCHARIDE IRON COMPLEX 150 MG PO CAPS
150.0000 mg | ORAL_CAPSULE | Freq: Every day | ORAL | Status: DC
  Administered 2021-02-13: 150 mg via ORAL
  Filled 2021-02-13: qty 1

## 2021-02-13 NOTE — Discharge Instructions (Signed)
Lactation outpatient support - home visit ° ° °Jessica Bowers, IBCLC (lactation consultant)  & Birth Doula ° °Phone (text or call): [phone] °Email: [email or web address] °[email or web address] ° ° °Linda Coppola °RN, MHA, IBCLC °at [facility]: Lactation Consultant ° °[email or web address] °Mail: [email or web address] °Tel: [phone] ° ° °Additional breastfeeding resources: ° °[facility] °https://ibconline.ca/information-sheets/ ° °[employer] [place] ° °www.lllofnc.org ° ° °Other Resources: ° °Chiropractic specialist  ° °Dr. Leanna Hastings °[email or web address] ° ° °Craniosacral therapy for baby ° °Erin Balkind  °[email or web address] ° °

## 2021-02-13 NOTE — Discharge Summary (Signed)
OB Discharge Summary  Patient Name: Bethany Singleton DOB: 1990-11-25 MRN: [id number]  Date of admission: 02/11/2021 Delivering provider: Olivia Mackie   Admitting diagnosis: Gestational hypertension [O13.9] Intrauterine pregnancy: [redacted]w[redacted]d     Secondary diagnosis: Patient Active Problem List   Diagnosis Date Noted   Maternal anemia, with delivery - IDA w/ ABL 02/13/2021   Preeclampsia w/o severe features 02/11/2021   Status post primary low transverse cesarean section - FTP 12/13 02/11/2021   Postpartum care following cesarean delivery 12/13 02/11/2021   Anxiety 02/11/2021   Gestational diabetes mellitus (GDM), antepartum 12/18/2020   Additional problems:none   Date of discharge: 02/13/2021   Discharge diagnosis: Principal Problem:   Postpartum care following cesarean delivery 12/13 Active Problems:   Gestational diabetes mellitus (GDM), antepartum   Preeclampsia w/o severe features   Status post primary low transverse cesarean section - FTP 12/13   Anxiety   Maternal anemia, with delivery - IDA w/ ABL                                                              Post partum procedures: IV Venofer  Augmentation: AROM, Pitocin, and Cytotec Pain control: Epidural  Laceration:None  Episiotomy:None  Complications: None  Hospital course:  Induction of Labor With Cesarean Section   30 y.o. yo G1P1001 at [redacted]w[redacted]d was admitted to the hospital 02/11/2021 for induction of labor. Patient had a labor course significant for PEC w/o severe features. Labor progress to 5 cm then no change x 8 hrs, fetal intolerance to labor. The patient went for cesarean section due to Arrest of Dilation and Non-Reassuring FHR. Delivery details are as follows: Membrane Rupture Time/Date: 8:22 AM ,02/11/2021   Delivery Method:C-Section, Low Transverse  Details of operation can be found in separate operative Note.  Patient had an uncomplicated postpartum course. Magnesium Sulphate for seizure prophylaxis  x 24 hrs PP at 1 gm / hr due to elevated serum creatinine (1.11, down to 1.05 on PPD2). Patient desires discharge home on POD 2. BP well controlled on Procardia 30 xl daily. She is ambulating, tolerating a regular diet, passing flatus, and urinating well.  Patient is discharged home in stable condition on 02/13/21.    Close follow up in office in 4 days.  Newborn Data: Birth date:02/11/2021  Birth time:6:41 PM  Gender:Female  Living status:Living  Apgars:8 ,9  Weight:2740 g                                Physical exam  Vitals:   02/12/21 1936 02/13/21 0557 02/13/21 0807 02/13/21 1156  BP: 130/72 115/66 131/84 134/74  Pulse: 85 84 87 93  Resp: 18 16 18 18   Temp: 97.6 F (36.4 C) 98.5 F (36.9 C) 98.2 F (36.8 C) 98 F (36.7 C)  TempSrc: Oral Oral Oral Oral  SpO2: 99% 95% 98% 100%  Weight:      Height:       General: alert, cooperative, and no distress Lochia: appropriate Uterine Fundus: firm Incision: Healing well with no significant drainage, Dressing is clean, dry, and intact DVT Evaluation: No cords or calf tenderness. Calf/Ankle edema is present Labs: Lab Results  Component Value Date   WBC 13.8 (H) 02/13/2021   HGB  7.4 (L) 02/13/2021   HCT 21.9 (L) 02/13/2021   MCV 87.6 02/13/2021   PLT 175 02/13/2021   CMP Latest Ref Rng & Units 02/13/2021  Glucose 70 - 99 mg/dL 76  BUN 6 - 20 mg/dL 13  Creatinine 2.95 - 6.21 mg/dL 3.08(M)  Sodium 578 - 469 mmol/L 136  Potassium 3.5 - 5.1 mmol/L 3.8  Chloride 98 - 111 mmol/L 103  CO2 22 - 32 mmol/L 24  Calcium 8.9 - 10.3 mg/dL 7.8(L)  Total Protein 6.5 - 8.1 g/dL 4.7(L)  Total Bilirubin 0.3 - 1.2 mg/dL 0.4  Alkaline Phos 38 - 126 U/L 81  AST 15 - 41 U/L 24  ALT 0 - 44 U/L 11   Edinburgh Postnatal Depression Scale Screening Tool 02/12/2021  I have been able to laugh and see the funny side of things. 0  I have looked forward with enjoyment to things. 0  I have blamed myself unnecessarily when things went wrong. 1  I have  been anxious or worried for no good reason. 1  I have felt scared or panicky for no good reason. 0  Things have been getting on top of me. 1  I have been so unhappy that I have had difficulty sleeping. 0  I have felt sad or miserable. 0  I have been so unhappy that I have been crying. 0  The thought of harming myself has occurred to me. 0  Edinburgh Postnatal Depression Scale Total 3   Vaccines: TDaP          UTD        COVID-19   UTD        Flu             UTD  Discharge instruction:  per After Visit Summary,  [facility] OB booklet and  "Understanding Mother & Baby Care" hospital booklet  After Visit Meds:  Allergies as of 02/13/2021       Reactions   Latex    Band aids primarily, when left on too long they remove skin        Medication List     STOP taking these medications    estradiol 0.1 MG/24HR patch Commonly known as: VIVELLE-DOT   estradiol 2 MG tablet Commonly known as: ESTRACE       TAKE these medications    acetaminophen 500 MG tablet Commonly known as: TYLENOL Take 2 tablets (1,000 mg total) by mouth every 6 (six) hours.   citalopram 40 MG tablet Commonly known as: CELEXA TAKE 1 TABLET(40 MG) BY MOUTH DAILY   ibuprofen 600 MG tablet Commonly known as: ADVIL Take 1 tablet (600 mg total) by mouth every 6 (six) hours.   iron polysaccharides 150 MG capsule Commonly known as: Ferrex 150 Take 1 capsule (150 mg total) by mouth daily.   magnesium oxide 400 (240 Mg) MG tablet Commonly known as: MAG-OX Take 1 tablet (400 mg total) by mouth daily. For prevention of constipation.   metFORMIN 1000 MG tablet Commonly known as: GLUCOPHAGE Take 1,000 mg by mouth 2 (two) times daily.   NIFEdipine 30 MG 24 hr tablet Commonly known as: ADALAT CC Take 1 tablet (30 mg total) by mouth daily. Start taking on: February 14, 2021   oxyCODONE 5 MG immediate release tablet Commonly known as: Oxy IR/ROXICODONE Take 1 tablet (5 mg total) by mouth every 6 (six)  hours as needed for up to 5 days for moderate pain.   PRENATAL VITAMIN PO Take by mouth.  progesterone 50 MG/ML injection Inject 50 mg into the muscle daily.   senna-docusate 8.6-50 MG tablet Commonly known as: Senokot-S Take 2 tablets by mouth daily. Start taking on: February 14, 2021        Diet: iron rich diet  Activity: Advance as tolerated. Pelvic rest for 6 weeks.   Postpartum contraception: TBA in office  Newborn Data: Live born female  Birth Weight: 6 lb 0.7 oz (2740 g) APGAR: 8, 9  Newborn Delivery   Birth date/time: 02/11/2021 18:41:00 Delivery type: C-Section, Low Transverse Trial of labor: Yes C-section categorization: Primary      named Konner Baby Feeding: Bottle and Breast Disposition:home with mother Circumcision: completed   Delivery Report:  Review the Delivery Report for details.    Follow up:  Follow-up Information     Olivia Mackie, MD. Schedule an appointment as soon as possible for a visit on 02/17/2021.   Specialty: Obstetrics and Gynecology Why: For Postpartum follow-up / BP check Contact information: [address] [place] [address] [phone]                   Signed: Neta Mends, CNM, MSN 02/13/2021, 3:12 PM

## 2021-02-13 NOTE — Lactation Note (Signed)
This note was copied from a baby's chart. Lactation Consultation Note  Patient Name: Bethany Singleton [id number] Date: 02/13/2021 Reason for consult: Follow-up assessment Age:29 Hours  P1, Mother reports that infant breastfeed frequent during the night and her nipples are slightly sore.   Mother reports that she just fed infant for 15 mins. And that she pumped and gave infant 2  ml of ebm with a bottle. She reports that infant sucks strong for at least 15 mins. Each feeding .infant is sleeping lying on mothers chest.  Mother is effectively hand expressing and offering infant tiny bits of colostrum in a spoon.  Mother is pumping after feeding. Using the pump at the bedside.   Discussed the ETI handout with volumes to be given .Discussed option of DBM . Mother thinks that [facility] is a good option. Mother to discuss with nurse and plans   Staff nurse to follow up and get DBM.  to offer DMB. Encouraged to offer infant during the hours of 24-48 hours after birth 10-20 ml every 3 hours. Then 48-72 Hours after birth offer 20-30 every feeding. After 72 hours after birth give five infant 30 ml every feeding or more.   Mother to continue to breastfeed infant and rotate positions. Mother reports that her nipples are sore . She denies having any cracking or bruising . Request to see mothers nipples to reports any trauma and mother reports that nipples are just pink and she is using butter that she brought from home and coconut oil.   Mother has a luna Motif pump at home. She is using a #21 flange here with the [employer] pump. Mother to pump after every feeding for 15 mins every 3 hours. Encouraged mother to continue to hand express and to do good massage.    Maternal Data    Feeding Mother's Current Feeding Choice: Breast Milk  LATCH Score                    Lactation Tools Discussed/Used    Interventions Interventions: Breast feeding basics reviewed;Skin to  skin;Education  Discharge Pump: Manual;Personal  Consult Status Consult Status: Follow-up Date: 02/13/21 Follow-up type: In-patient    Stevan Born [facility] 02/13/2021, 8:48 AM

## 2021-02-25 ENCOUNTER — Telehealth (HOSPITAL_COMMUNITY): Payer: Self-pay

## 2021-02-25 NOTE — Telephone Encounter (Signed)
°  No answer. Left message to return nurse call.  Marcelino Duster [facility] 02/25/2021,1402

## 2021-03-05 ENCOUNTER — Encounter: Payer: Self-pay | Admitting: Nurse Practitioner

## 2021-03-05 ENCOUNTER — Ambulatory Visit (INDEPENDENT_AMBULATORY_CARE_PROVIDER_SITE_OTHER): Payer: 59 | Admitting: Nurse Practitioner

## 2021-03-05 DIAGNOSIS — U071 COVID-19: Secondary | ICD-10-CM | POA: Diagnosis not present

## 2021-03-05 MED ORDER — AMOXICILLIN-POT CLAVULANATE 875-125 MG PO TABS: 1.0000 | ORAL_TABLET | Freq: Two times a day (BID) | ORAL | 0 refills | Status: DC

## 2021-03-05 NOTE — Addendum Note (Signed)
Addended by: Thana Ates on: 03/05/2021 06:22 PM   Modules accepted: Orders

## 2021-03-05 NOTE — Assessment & Plan Note (Signed)
Positive COVID-19 at home test.  Take meds as prescribed - Use a cool mist humidifier  -Use saline nose sprays frequently -Force fluids -chloraseptic spray  - warm salt water gargle -For fever or aches or pains- take Tylenol or ibuprofen. - Patient is currently breast feeding and declined Paxlovid. I educated patient to Pump and dump breast milk for 9 days while taking antiviral and then supplement with Formula but patient will rather  breast feed for now. There are no research to breast feed with Paxlovid. I will treat patient for pharyngitis as she is reporting sever sore throat and swollen lymph nodes as part of her COVID symptoms -Augmentin 875-125 mg tablet by mouth twice daily. RX sent to pharmacy Follow up with worsening unresolved symptom

## 2021-03-05 NOTE — Progress Notes (Signed)
Virtual Visit  Note Due to COVID-19 pandemic this visit was conducted virtually. This visit type was conducted due to national recommendations for restrictions regarding the COVID-19 Pandemic (e.g. social distancing, sheltering in place) in an effort to limit this patient's exposure and mitigate transmission in our community. All issues noted in this document were discussed and addressed.  A physical exam was not performed with this format.  I connected with Bethany Singleton on 03/05/21 at 1:30 PM by telephone and verified that I am speaking with the correct person using two identifiers. Bethany Singleton is currently located at home during visit. The provider, Daryll Drown, NP is located in their office at time of visit.  I discussed the limitations, risks, security and privacy concerns of performing an evaluation and management service by telephone and the availability of in person appointments. I also discussed with the patient that there may be a patient responsible charge related to this service. The patient expressed understanding and agreed to proceed.   History and Present Illness:  Sore Throat  This is a new problem. Episode onset: in the past3 days. The problem has been gradually worsening. The maximum temperature recorded prior to her arrival was 101 - 101.9 F. The pain is severe. Associated symptoms include congestion, coughing, headaches and swollen glands. She has had no exposure to strep.     Review of Systems  Constitutional:  Positive for chills and fever.  HENT:  Positive for congestion and sore throat.   Respiratory:  Positive for cough.   Gastrointestinal: Negative.   Genitourinary: Negative.   Skin:  Negative for rash.  Neurological:  Positive for headaches.  All other systems reviewed and are negative.   Observations/Objective: Televisit patient not in distress.  Assessment and Plan:  Positive COVID-19 at home test.  Take meds as prescribed -  Use a cool mist humidifier  -Use saline nose sprays frequently -Force fluids -chloraseptic spray  - warm salt water gargle -For fever or aches or pains- take Tylenol or ibuprofen. - Patient is currently breast feeding and declined Paxlovid. I educated patient to Pump and dump breast milk for 9 days while taking antiviral and then supplement with Formula but patient will rather  breast feed for now. There are no research to breast feed with Paxlovid. I will treat patient for pharyngitis as she is reporting sever sore throat and swollen lymph nodes as part of her COVID symptoms -Augmentin 875-125 mg tablet by mouth twice daily. RX sent to pharmacy Follow up with worsening unresolved symptoms   Follow Up Instructions: Follow-up with unresolved worsening symptoms.    I discussed the assessment and treatment plan with the patient. The patient was provided an opportunity to ask questions and all were answered. The patient agreed with the plan and demonstrated an understanding of the instructions.   The patient was advised to call back or seek an in-person evaluation if the symptoms worsen or if the condition fails to improve as anticipated.  The above assessment and management plan was discussed with the patient. The patient verbalized understanding of and has agreed to the management plan. Patient is aware to call the clinic if symptoms persist or worsen. Patient is aware when to return to the clinic for a follow-up visit. Patient educated on when it is appropriate to go to the emergency department.   Time call ended:  1:41 pm   I provided 11 minutes of  non face-to-face time during this encounter.  Daryll Drown, NP

## 2021-03-06 ENCOUNTER — Telehealth: Payer: 59 | Admitting: Family

## 2021-03-31 ENCOUNTER — Other Ambulatory Visit: Payer: Self-pay | Admitting: Nurse Practitioner

## 2021-04-01 ENCOUNTER — Ambulatory Visit (INDEPENDENT_AMBULATORY_CARE_PROVIDER_SITE_OTHER): Payer: 59 | Admitting: Nurse Practitioner

## 2021-04-01 ENCOUNTER — Other Ambulatory Visit: Payer: Self-pay | Admitting: Nurse Practitioner

## 2021-04-01 ENCOUNTER — Encounter: Payer: Self-pay | Admitting: Nurse Practitioner

## 2021-04-01 VITALS — BP 115/82 | HR 87 | Temp 98.4°F | Ht 64.0 in | Wt 167.0 lb

## 2021-04-01 DIAGNOSIS — R051 Acute cough: Secondary | ICD-10-CM | POA: Diagnosis not present

## 2021-04-01 DIAGNOSIS — J029 Acute pharyngitis, unspecified: Secondary | ICD-10-CM

## 2021-04-01 LAB — RAPID STREP SCREEN (MED CTR MEBANE ONLY): Strep Gp A Ag, IA W/Reflex: NEGATIVE

## 2021-04-01 LAB — CULTURE, GROUP A STREP

## 2021-04-01 MED ORDER — AZITHROMYCIN 250 MG PO TABS: ORAL_TABLET | ORAL | 0 refills | Status: DC

## 2021-04-01 MED ORDER — AZITHROMYCIN 250 MG PO TABS: ORAL_TABLET | ORAL | 0 refills | Status: AC

## 2021-04-01 NOTE — Patient Instructions (Signed)
Sore Throat When you have a sore throat, your throat may feel: Tender. Burning. Irritated. Scratchy. Painful when you swallow. Painful when you talk. Many things can cause a sore throat, such as: An infection. Allergies. Dry air. Smoke or pollution. Radiation treatment for cancer. Gastroesophageal reflux disease (GERD). A tumor. A sore throat can be the first sign of another sickness. It can happen with other problems, like: Coughing. Sneezing. Fever. Swelling of the glands in the neck. Most sore throats go away without treatment. Follow these instructions at home:   Medicines Take over-the-counter and prescription medicines only as told by your doctor. Children often get sore throats. Do not give your child aspirin. Use throat sprays to soothe your throat as told by your health care provider. Managing pain To help with pain: Sip warm liquids, such as broth, herbal tea, or warm water. Eat or drink cold or frozen liquids, such as frozen ice pops. Rinse your mouth (gargle) with a salt water mixture 3-4 times a day or as needed. To make salt water, dissolve -1 tsp (3-6 g) of salt in 1 cup (237 mL) of warm water. Do not swallow this mixture. Suck on hard candy or throat lozenges. Put a cool-mist humidifier in your bedroom at night. Sit in the bathroom with the door closed for 5-10 minutes while you run hot water in the shower. General instructions Do not smoke or use any products that contain nicotine or tobacco. If you need help quitting, ask your doctor. Get plenty of rest. Drink enough fluid to keep your pee (urine) pale yellow. Wash your hands often for at least 20 seconds with soap and water. If soap and water are not available, use hand sanitizer. Contact a doctor if: You have a fever for more than 2-3 days. You keep having symptoms for more than 2-3 days. Your throat does not get better in 7 days. You have a fever and your symptoms suddenly get worse. Your child  who is 3 months to 3 years old has a temperature of 102.2F (39C) or higher. Get help right away if: You have trouble breathing. You cannot swallow fluids, soft foods, or your spit. You have swelling in your throat or neck that gets worse. You feel like you may vomit (nauseous) and this feeling lasts a long time. You cannot stop vomiting. These symptoms may be an emergency. Get help right away. Call your local emergency services (911 in the [place] Do not wait to see if the symptoms will go away. Do not drive yourself to the hospital. Summary A sore throat is a painful, burning, irritated, or scratchy throat. Many things can cause a sore throat. Take over-the-counter medicines only as told by your doctor. Get plenty of rest. Drink enough fluid to keep your pee (urine) pale yellow. Contact a doctor if your symptoms get worse or your sore throat does not get better within 7 days. This information is not intended to replace advice given to you by your health care provider. Make sure you discuss any questions you have with your health care provider. Document Revised: 05/15/2020 Document Reviewed: 05/15/2020 Elsevier Patient Education  2022 Elsevier Inc.  

## 2021-04-01 NOTE — Addendum Note (Signed)
Addended by: Daryll Drown on: 04/01/2021 11:55 AM   Modules accepted: Orders

## 2021-04-01 NOTE — Progress Notes (Signed)
Acute Office Visit  Subjective:    Patient ID: Bethany Singleton, female    DOB: October 28, 1990, 31 y.o.   MRN: [id number]  Chief Complaint  Patient presents with   Sore Throat    Runny nose Swollen lymph nodes Covid (+) and strep (+) in early January    Sore Throat  This is a recurrent problem. The current episode started in the past 7 days. The problem has been gradually worsening. The pain is worse on the right side. There has been no fever. The pain is moderate. Associated symptoms include coughing. Pertinent negatives include no abdominal pain, congestion, ear pain or headaches. She has had no exposure to strep or mono. She has tried nothing for the symptoms.  Cough This is a recurrent problem. The current episode started in the past 7 days. The problem has been unchanged. The problem occurs constantly. The cough is Non-productive. Associated symptoms include nasal congestion and a sore throat. Pertinent negatives include no ear congestion, ear pain, headaches or rash. Nothing aggravates the symptoms.    Past Medical History:  Diagnosis Date   Anxiety    Tuberculosis     Past Surgical History:  Procedure Laterality Date   CESAREAN SECTION N/A 02/11/2021   Procedure: CESAREAN SECTION;  Surgeon: Olivia Mackie, MD;  Location: [facility];  Service: Obstetrics;  Laterality: N/A;   COLONOSCOPY WITH PROPOFOL N/A 12/13/2018   Procedure: COLONOSCOPY WITH PROPOFOL;  Surgeon: West Bali, MD;  Location: [facility];  Service: Endoscopy;  Laterality: N/A;  12:00pm   lymph node removal from behind R ear      Family History  Problem Relation Age of Onset   Colon cancer Neg Hx    Colon polyps Neg Hx    Inflammatory bowel disease Neg Hx     Social History   Socioeconomic History   Marital status: Married    Spouse name: Not on file   Number of children: Not on file   Years of education: Not on file   Highest education level: Not on file  Occupational History   Not on  file  Tobacco Use   Smoking status: Never   Smokeless tobacco: Never  Vaping Use   Vaping Use: Never used  Substance and Sexual Activity   Alcohol use: Yes    Comment: weekends- glass or two of wine   Drug use: No   Sexual activity: Yes    Birth control/protection: Injection  Other Topics Concern   Not on file  Social History Narrative   Not on file   Social Determinants of Health   Financial Resource Strain: Not on file  Food Insecurity: No Food Insecurity   Worried About Programme researcher, broadcasting/film/video in the Last Year: Never true   Ran Out of Food in the Last Year: Never true  Transportation Needs: Not on file  Physical Activity: Not on file  Stress: Not on file  Social Connections: Not on file  Intimate Partner Violence: Not on file    Outpatient Medications Prior to Visit  Medication Sig Dispense Refill   acetaminophen (TYLENOL) 500 MG tablet Take 2 tablets (1,000 mg total) by mouth every 6 (six) hours. 30 tablet 0   citalopram (CELEXA) 40 MG tablet TAKE 1 TABLET(40 MG) BY MOUTH DAILY 90 tablet 1   ibuprofen (ADVIL) 600 MG tablet Take 1 tablet (600 mg total) by mouth every 6 (six) hours. 30 tablet 0   amoxicillin-clavulanate (AUGMENTIN) 875-125 MG tablet Take 1  tablet by mouth 2 (two) times daily. 20 tablet 0   iron polysaccharides (FERREX 150) 150 MG capsule Take 1 capsule (150 mg total) by mouth daily.     magnesium oxide (MAG-OX) 400 (240 Mg) MG tablet Take 1 tablet (400 mg total) by mouth daily. For prevention of constipation. 30 tablet    metFORMIN (GLUCOPHAGE) 1000 MG tablet Take 1,000 mg by mouth 2 (two) times daily.     NIFEdipine (ADALAT CC) 30 MG 24 hr tablet Take 1 tablet (30 mg total) by mouth daily. 30 tablet 0   Prenatal Vit-Fe Fumarate-FA (PRENATAL VITAMIN PO) Take by mouth.     progesterone 50 MG/ML injection Inject 50 mg into the muscle daily.     senna-docusate (SENOKOT-S) 8.6-50 MG tablet Take 2 tablets by mouth daily.     No facility-administered medications  prior to visit.    Allergies  Allergen Reactions   Latex     Band aids primarily, when left on too long they remove skin    Review of Systems  Constitutional: Negative.   HENT:  Positive for sore throat. Negative for congestion, ear pain, sinus pressure and sneezing.   Eyes: Negative.   Respiratory:  Positive for cough.   Cardiovascular: Negative.   Gastrointestinal: Negative.  Negative for abdominal pain.  Musculoskeletal: Negative.   Skin: Negative.  Negative for rash.  Neurological:  Negative for headaches.  All other systems reviewed and are negative.     Objective:    Physical Exam Vitals and nursing note reviewed.  Constitutional:      Appearance: She is normal weight.  HENT:     Head: Normocephalic.     Right Ear: External ear normal.     Left Ear: External ear normal.     Nose: Congestion present.  Eyes:     Conjunctiva/sclera: Conjunctivae normal.  Cardiovascular:     Rate and Rhythm: Normal rate and regular rhythm.  Pulmonary:     Effort: Pulmonary effort is normal.     Breath sounds: Normal breath sounds.  Abdominal:     General: Bowel sounds are normal.  Skin:    General: Skin is warm.     Findings: No rash.  Neurological:     Mental Status: She is oriented to person, place, and time.    BP 115/82    Pulse 87    Temp 98.4 F (36.9 C)    Ht 5\' 4"  (1.626 m)    Wt 167 lb (75.8 kg)    LMP 05/08/2020 (Approximate)    SpO2 97%    Breastfeeding Yes    BMI 28.67 kg/m  Wt Readings from Last 3 Encounters:  04/01/21 167 lb (75.8 kg)  02/11/21 223 lb 9.6 oz (101.4 kg)  07/15/20 178 lb 12.8 oz (81.1 kg)    Health Maintenance Due  Topic Date Due   COVID-19 Vaccine (1) Never done   Hepatitis C Screening  Never done   PAP SMEAR-Modifier  03/24/2018   INFLUENZA VACCINE  Never done    There are no preventive care reminders to display for this patient.   No results found for: TSH Lab Results  Component Value Date   WBC 13.8 (H) 02/13/2021   HGB 7.4  (L) 02/13/2021   HCT 21.9 (L) 02/13/2021   MCV 87.6 02/13/2021   PLT 175 02/13/2021   Lab Results  Component Value Date   NA 136 02/13/2021   K 3.8 02/13/2021   CO2 24 02/13/2021   GLUCOSE 76  02/13/2021   BUN 13 02/13/2021   CREATININE 1.05 (H) 02/13/2021   BILITOT 0.4 02/13/2021   ALKPHOS 81 02/13/2021   AST 24 02/13/2021   ALT 11 02/13/2021   PROT 4.7 (L) 02/13/2021   ALBUMIN 2.0 (L) 02/13/2021   CALCIUM 7.8 (L) 02/13/2021   ANIONGAP 9 02/13/2021   No results found for: CHOL No results found for: HDL No results found for: LDLCALC No results found for: TRIG No results found for: CHOLHDL No results found for: HQIO9G     Assessment & Plan:   Take meds as prescribed - Use a cool mist humidifier  -Use saline nose sprays frequently -Force fluids -For fever or aches or pains- take Tylenol or ibuprofen. -completed strep swab results pending -If symptoms do not improve, she may need to be COVID tested to rule this out Follow up with worsening unresolved symptoms  Problem List Items Addressed This Visit   None Visit Diagnoses     Sore throat    -  Primary   Relevant Medications   azithromycin (ZITHROMAX) 250 MG tablet   Other Relevant Orders   Rapid Strep Screen ([facility] ONLY)   Acute cough            Meds ordered this encounter  Medications   azithromycin (ZITHROMAX) 250 MG tablet    Sig: Take 2 tablets on day 1, then 1 tablet daily on days 2 through 5    Dispense:  6 tablet    Refill:  0    Order Specific Question:   Supervising Provider    AnswerStandley Brooking     Daryll Drown, NP

## 2021-04-16 ENCOUNTER — Ambulatory Visit: Payer: 59 | Admitting: Family Medicine

## 2021-05-07 ENCOUNTER — Other Ambulatory Visit: Payer: Self-pay | Admitting: Family Medicine

## 2021-05-07 DIAGNOSIS — F411 Generalized anxiety disorder: Secondary | ICD-10-CM

## 2021-06-08 ENCOUNTER — Other Ambulatory Visit: Payer: Self-pay | Admitting: Family Medicine

## 2021-06-08 DIAGNOSIS — F411 Generalized anxiety disorder: Secondary | ICD-10-CM

## 2021-06-09 ENCOUNTER — Encounter: Payer: Self-pay | Admitting: Family Medicine

## 2021-06-09 NOTE — Telephone Encounter (Signed)
LMOM to schedule appointment / Letter mailed ?

## 2021-06-09 NOTE — Telephone Encounter (Signed)
30 days given 05/08/2021 pt ntbs  ?

## 2021-07-23 ENCOUNTER — Encounter: Payer: Self-pay | Admitting: Family Medicine

## 2021-07-24 NOTE — Telephone Encounter (Signed)
Televisit is fine, under the circumsances

## 2021-08-12 ENCOUNTER — Telehealth (INDEPENDENT_AMBULATORY_CARE_PROVIDER_SITE_OTHER): Payer: 59 | Admitting: Family Medicine

## 2021-08-12 ENCOUNTER — Encounter: Payer: Self-pay | Admitting: Family Medicine

## 2021-08-12 DIAGNOSIS — F411 Generalized anxiety disorder: Secondary | ICD-10-CM

## 2021-08-12 MED ORDER — CITALOPRAM HYDROBROMIDE 20 MG PO TABS: 60.0000 mg | ORAL_TABLET | Freq: Every day | ORAL | 1 refills | Status: DC

## 2021-08-12 NOTE — Progress Notes (Signed)
Subjective:    Patient ID: Bethany Singleton, female    DOB: [birth year], 31 y.o.   MRN: [id number]   HPI: Bethany Singleton is a 31 y.o. female presenting for recheck of her chronic anxiety.  She also has some depressive symptoms at times as well.  Primarily she just worries a lot.  She also is going through stressors of some time prevention anxiety between she and her 52-month-old infant.  She is back at work now.  She misses the baby a lot.  She worries about him while she is not with him.  She also has run out of her Celexa.  She says she is feeling overwhelmed.  When she takes the Celexa however, it when wears off around 3:30 in the afternoon.  She wonders if a higher dose would help to keep the medicine in her system better so that she was not anxious so much in the evenings.      04/01/2021   11:25 AM 12/18/2020    1:48 PM 07/15/2020    3:42 PM 10/28/2018    3:20 PM 09/16/2016    3:55 PM  Depression screen PHQ 2/9  Decreased Interest 0 0 0 0 1  Down, Depressed, Hopeless 0 0 0 0 0  PHQ - 2 Score 0 0 0 0 1  Altered sleeping 0  0    Tired, decreased energy 0  1    Change in appetite 0  0    Feeling bad or failure about yourself  0  0    Trouble concentrating 0  0    Moving slowly or fidgety/restless 0  0    Suicidal thoughts 0  0    PHQ-9 Score 0  1    Difficult doing work/chores Not difficult at all  Not difficult at all       Relevant past medical, surgical, family and social history reviewed and updated as indicated.  Interim medical history since our last visit reviewed. Allergies and medications reviewed and updated.  ROS:  Review of Systems  Review of Systems  Constitutional: Negative.   HENT: Negative.    Eyes:  Negative for visual disturbance.  Respiratory:  Negative for shortness of breath.   Cardiovascular:  Negative for chest pain.  Gastrointestinal:  Negative for abdominal pain.  Musculoskeletal:  Negative for arthralgias.  Psychiatric/Behavioral:   The patient is nervous/anxious.     Social History   Tobacco Use  Smoking Status Never  Smokeless Tobacco Never       Objective:     Wt Readings from Last 3 Encounters:  04/01/21 167 lb (75.8 kg)  02/11/21 223 lb 9.6 oz (101.4 kg)  07/15/20 178 lb 12.8 oz (81.1 kg)     Exam deferred. Pt. Harboring due to COVID 19. Phone visit performed.   Assessment & Plan:   1. GAD (generalized anxiety disorder)     Meds ordered this encounter  Medications   citalopram (CELEXA) 20 MG tablet    Sig: Take 3 tablets (60 mg total) by mouth daily.    Dispense:  270 tablet    Refill:  1    No orders of the defined types were placed in this encounter.     Diagnoses and all orders for this visit:  GAD (generalized anxiety disorder) -     citalopram (CELEXA) 20 MG tablet; Take 3 tablets (60 mg total) by mouth daily.    Virtual Visit via telephone Note  I discussed the limitations,  risks, security and privacy concerns of performing an evaluation and management service by telephone and the availability of in person appointments. The patient was identified with two identifiers. Pt.expressed understanding and agreed to proceed. Pt. Is at home. Dr. Darlyn Read is in his office.  Follow Up Instructions:   I discussed the assessment and treatment plan with the patient. The patient was provided an opportunity to ask questions and all were answered. The patient agreed with the plan and demonstrated an understanding of the instructions.   The patient was advised to call back or seek an in-person evaluation if the symptoms worsen or if the condition fails to improve as anticipated.   Total minutes including chart review and phone contact time: 12   Follow up plan: Return in about 6 weeks (around 09/23/2021).  Mechele Claude, MD [place] [facility]

## 2021-10-02 ENCOUNTER — Encounter: Payer: Self-pay | Admitting: Family Medicine

## 2021-10-02 ENCOUNTER — Ambulatory Visit (INDEPENDENT_AMBULATORY_CARE_PROVIDER_SITE_OTHER): Payer: 59 | Admitting: Family Medicine

## 2021-10-02 DIAGNOSIS — F411 Generalized anxiety disorder: Secondary | ICD-10-CM

## 2021-10-02 MED ORDER — QUETIAPINE FUMARATE 25 MG PO TABS: 25.0000 mg | ORAL_TABLET | Freq: Every day | ORAL | 1 refills | Status: DC

## 2021-10-02 MED ORDER — CITALOPRAM HYDROBROMIDE 20 MG PO TABS: 60.0000 mg | ORAL_TABLET | Freq: Every day | ORAL | 1 refills | Status: DC

## 2021-10-02 NOTE — Progress Notes (Signed)
Subjective:  Patient ID: Bethany Singleton, female    DOB: [birth year]  Age: 31 y.o. MRN: [id number]  CC: No chief complaint on file.   HPI [employer] presents for recheck of anxiety. Was wearing off in the afternoon. However now much better with 60 mg. Not wearing off. Mentally exhausted at the end of the day. Helps her stay calm, but over thinking all the time. Describes relationship with her wife as going well. Still adjusting, with some anxiety, to caring for their infant & having to be apart in the day while at work.      04/01/2021   11:25 AM 12/18/2020    1:48 PM 07/15/2020    3:42 PM  Depression screen PHQ 2/9  Decreased Interest 0 0 0  Down, Depressed, Hopeless 0 0 0  PHQ - 2 Score 0 0 0  Altered sleeping 0  0  Tired, decreased energy 0  1  Change in appetite 0  0  Feeling bad or failure about yourself  0  0  Trouble concentrating 0  0  Moving slowly or fidgety/restless 0  0  Suicidal thoughts 0  0  PHQ-9 Score 0  1  Difficult doing work/chores Not difficult at all  Not difficult at all      10/02/2021    3:41 PM 04/01/2021   11:25 AM 07/15/2020    3:43 PM 10/28/2018    3:33 PM  GAD 7 : Generalized Anxiety Score  Nervous, Anxious, on Edge 1 0 0 3  Control/stop worrying 2 0 0 3  Worry too much - different things 2 0 0 2  Trouble relaxing 2 0 0 2  Restless 1 0 0 1  Easily annoyed or irritable 3 0 0 2  Afraid - awful might happen 0 0 0 0  Total GAD 7 Score 11 0 0 13  Anxiety Difficulty  Not difficult at all  Somewhat difficult     History Rejeana has a past medical history of Anxiety and Tuberculosis.   She has a past surgical history that includes lymph node removal from behind R ear; Colonoscopy with propofol (N/A, 12/13/2018); and Cesarean section (N/A, 02/11/2021).   Her family history is not on file.She reports that she has never smoked. She has never used smokeless tobacco. She reports current alcohol use. She reports that she does not use  drugs.    ROS Review of Systems  Constitutional: Negative.   HENT: Negative.    Eyes:  Negative for visual disturbance.  Respiratory:  Negative for shortness of breath.   Cardiovascular:  Negative for chest pain.  Gastrointestinal:  Negative for abdominal pain.  Musculoskeletal:  Negative for arthralgias.    Objective:  There were no vitals taken for this visit.  BP Readings from Last 3 Encounters:  04/01/21 115/82  02/13/21 134/74  07/15/20 121/81    Wt Readings from Last 3 Encounters:  04/01/21 167 lb (75.8 kg)  02/11/21 223 lb 9.6 oz (101.4 kg)  07/15/20 178 lb 12.8 oz (81.1 kg)     Physical Exam  Exam deferred. Phone visit performed.   Assessment & Plan:   Diagnoses and all orders for this visit:  GAD (generalized anxiety disorder) -     citalopram (CELEXA) 20 MG tablet; Take 3 tablets (60 mg total) by mouth daily.  Other orders -     QUEtiapine (SEROQUEL) 25 MG tablet; Take 1 tablet (25 mg total) by mouth at bedtime.  I am having Meriam H. Hegler start on QUEtiapine. I am also having her maintain her acetaminophen, ibuprofen, and citalopram.  Allergies as of 10/02/2021       Reactions   Latex    Band aids primarily, when left on too long they remove skin        Medication List        Accurate as of October 02, 2021 11:59 PM. If you have any questions, ask your nurse or doctor.          acetaminophen 500 MG tablet Commonly known as: TYLENOL Take 2 tablets (1,000 mg total) by mouth every 6 (six) hours.   citalopram 20 MG tablet Commonly known as: CELEXA Take 3 tablets (60 mg total) by mouth daily.   ibuprofen 600 MG tablet Commonly known as: ADVIL Take 1 tablet (600 mg total) by mouth every 6 (six) hours.   QUEtiapine 25 MG tablet Commonly known as: SEROQUEL Take 1 tablet (25 mg total) by mouth at bedtime. Started by: Mechele Claude, MD       Virtual Visit via telephone Note  I discussed the limitations, risks,  security and privacy concerns of performing an evaluation and management service by telephone and the availability of in person appointments. I also discussed with the patient that there may be a patient responsible charge related to this service. The patient expressed understanding and agreed to proceed. Pt. Is at home. Dr. Darlyn Read is in his office.  Follow Up Instructions:   I discussed the assessment and treatment plan with the patient. The patient was provided an opportunity to ask questions and all were answered. The patient agreed with the plan and demonstrated an understanding of the instructions.   The patient was advised to call back or seek an in-person evaluation if the symptoms worsen or if the condition fails to improve as anticipated.  Total minutes including chart review and phone contact time: 14   Follow-up: Return in about 6 months (around 04/04/2022).  Mechele Claude, M.D.

## 2022-02-12 ENCOUNTER — Telehealth (INDEPENDENT_AMBULATORY_CARE_PROVIDER_SITE_OTHER): Payer: 59 | Admitting: Family Medicine

## 2022-02-12 ENCOUNTER — Encounter: Payer: Self-pay | Admitting: Family Medicine

## 2022-02-12 DIAGNOSIS — J0100 Acute maxillary sinusitis, unspecified: Secondary | ICD-10-CM | POA: Diagnosis not present

## 2022-02-12 MED ORDER — AMOXICILLIN-POT CLAVULANATE 875-125 MG PO TABS: 1.0000 | ORAL_TABLET | Freq: Two times a day (BID) | ORAL | 0 refills | Status: DC

## 2022-02-12 NOTE — Telephone Encounter (Signed)
Please offer her an appointment with me at 2:25, or with the DOD. Thanks

## 2022-02-12 NOTE — Progress Notes (Signed)
Subjective:    Patient ID: Bethany Singleton, female    DOB: 12-Dec-1990, 31 y.o.   MRN: [id number]   HPI: Bethany Singleton is a 31 y.o. female presenting for sore throat. Swollen & scratchy at the midline. No fever, chills sweats. Mild congestion. Some HA around the sinuses/eyes.  Posterior drainage is noted as well.  No earaches.  Minimal cough nonproductive.  Symptoms onset yesterday.  Worsening today.      04/01/2021   11:25 AM 12/18/2020    1:48 PM 07/15/2020    3:42 PM 10/28/2018    3:20 PM 09/16/2016    3:55 PM  Depression screen PHQ 2/9  Decreased Interest 0 0 0 0 1  Down, Depressed, Hopeless 0 0 0 0 0  PHQ - 2 Score 0 0 0 0 1  Altered sleeping 0  0    Tired, decreased energy 0  1    Change in appetite 0  0    Feeling bad or failure about yourself  0  0    Trouble concentrating 0  0    Moving slowly or fidgety/restless 0  0    Suicidal thoughts 0  0    PHQ-9 Score 0  1    Difficult doing work/chores Not difficult at all  Not difficult at all       Relevant past medical, surgical, family and social history reviewed and updated as indicated.  Interim medical history since our last visit reviewed. Allergies and medications reviewed and updated.  ROS:  Review of Systems  Constitutional:  Negative for appetite change, chills, diaphoresis, fatigue and fever.  HENT:  Positive for postnasal drip and sore throat. Negative for congestion, ear pain, hearing loss, rhinorrhea and trouble swallowing.   Respiratory:  Positive for cough. Negative for chest tightness and shortness of breath.   Cardiovascular:  Negative for chest pain and palpitations.  Skin:  Negative for rash.     Social History   Tobacco Use  Smoking Status Never  Smokeless Tobacco Never       Objective:     Wt Readings from Last 3 Encounters:  04/01/21 167 lb (75.8 kg)  02/11/21 223 lb 9.6 oz (101.4 kg)  07/15/20 178 lb 12.8 oz (81.1 kg)     Exam deferred. Pt. Harboring due to COVID  19. Phone visit performed.   Assessment & Plan:   1. Acute maxillary sinusitis, recurrence not specified     Meds ordered this encounter  Medications   amoxicillin-clavulanate (AUGMENTIN) 875-125 MG tablet    Sig: Take 1 tablet by mouth 2 (two) times daily. Take all of this medication    Dispense:  20 tablet    Refill:  0    No orders of the defined types were placed in this encounter.     Diagnoses and all orders for this visit:  Acute maxillary sinusitis, recurrence not specified  Other orders -     amoxicillin-clavulanate (AUGMENTIN) 875-125 MG tablet; Take 1 tablet by mouth 2 (two) times daily. Take all of this medication    Virtual Visit via telephone Note  I discussed the limitations, risks, security and privacy concerns of performing an evaluation and management service by telephone and the availability of in person appointments. The patient was identified with two identifiers. Pt.expressed understanding and agreed to proceed. Pt. Is at home. Dr. Darlyn Read is in his office.  Follow Up Instructions:   I discussed the assessment and treatment plan with the patient. The  patient was provided an opportunity to ask questions and all were answered. The patient agreed with the plan and demonstrated an understanding of the instructions.   The patient was advised to call back or seek an in-person evaluation if the symptoms worsen or if the condition fails to improve as anticipated.   Total minutes including chart review and phone contact time: 7   Follow up plan: Return if symptoms worsen or fail to improve.  Mechele Claude, MD [place] [facility]

## 2022-06-17 ENCOUNTER — Telehealth: Payer: Self-pay | Admitting: Physician Assistant

## 2022-06-17 DIAGNOSIS — H109 Unspecified conjunctivitis: Secondary | ICD-10-CM

## 2022-06-18 MED ORDER — POLYMYXIN B-TRIMETHOPRIM 10000-0.1 UNIT/ML-% OP SOLN: 1.0000 [drp] | OPHTHALMIC | 0 refills | Status: DC

## 2022-06-18 NOTE — Progress Notes (Signed)

## 2022-08-18 ENCOUNTER — Ambulatory Visit (INDEPENDENT_AMBULATORY_CARE_PROVIDER_SITE_OTHER): Payer: 59 | Admitting: Family Medicine

## 2022-08-18 ENCOUNTER — Encounter: Payer: Self-pay | Admitting: Family Medicine

## 2022-08-18 VITALS — BP 112/67 | HR 84 | Temp 98.0°F | Ht 64.0 in | Wt 187.8 lb

## 2022-08-18 DIAGNOSIS — R6884 Jaw pain: Secondary | ICD-10-CM

## 2022-08-18 DIAGNOSIS — G8929 Other chronic pain: Secondary | ICD-10-CM

## 2022-08-18 DIAGNOSIS — F411 Generalized anxiety disorder: Secondary | ICD-10-CM

## 2022-08-18 MED ORDER — PHENTERMINE HCL 37.5 MG PO CAPS: 37.5000 mg | ORAL_CAPSULE | ORAL | 2 refills | Status: DC

## 2022-08-18 NOTE — Progress Notes (Signed)
Subjective:  Patient ID: Bethany Singleton, female    DOB: May 16, 1990  Age: 32 y.o. MRN: [id number]  CC: Medical Management of Chronic Issues   HPI Bethany Singleton presents for tapering off of zoloft. Wants to go slowly so Bethany Singleton can control Bethany Singleton own anxiety. Wants to get pregnant. Planning to start trying in the fall. May need in vitro Quetiapine was causing Bethany Singleton to be to drowsy. Dced. No replacement desired.   Sister getting married in October. Wants to lose 30 lbs. To fit into Bethany Singleton dress.  Planning to change diet. Wants to take phentermine.  Jaw has been popping can't open Bethany Singleton mouth at times. Excruciating pain at times.      08/18/2022   11:18 AM 04/01/2021   11:25 AM 12/18/2020    1:48 PM  Depression screen PHQ 2/9  Decreased Interest 0 0 0  Down, Depressed, Hopeless 0 0 0  PHQ - 2 Score 0 0 0  Altered sleeping  0   Tired, decreased energy  0   Change in appetite  0   Feeling bad or failure about yourself   0   Trouble concentrating  0   Moving slowly or fidgety/restless  0   Suicidal thoughts  0   PHQ-9 Score  0   Difficult doing work/chores  Not difficult at all     History Bethany Singleton has a past medical history of Anxiety and Tuberculosis.   Bethany Singleton has a past surgical history that includes lymph node removal from behind R ear; Colonoscopy with propofol (N/A, 12/13/2018); and Cesarean section (N/A, 02/11/2021).   Bethany Singleton family history is not on file.Bethany Singleton reports that Bethany Singleton has never smoked. Bethany Singleton has never used smokeless tobacco. Bethany Singleton reports current alcohol use. Bethany Singleton reports that Bethany Singleton does not use drugs.    ROS Review of Systems  Constitutional: Negative.   HENT: Negative.    Eyes:  Negative for visual disturbance.  Respiratory:  Negative for shortness of breath.   Cardiovascular:  Negative for chest pain.  Gastrointestinal:  Negative for abdominal pain.  Musculoskeletal:  Negative for arthralgias.  Psychiatric/Behavioral:  The patient is nervous/anxious.      Objective:  BP 112/67   Pulse 84   Temp 98 F (36.7 C)   Ht 5\' 4"  (1.626 m)   Wt 187 lb 12.8 oz (85.2 kg)   SpO2 98%   BMI 32.24 kg/m   BP Readings from Last 3 Encounters:  08/18/22 112/67  04/01/21 115/82  02/13/21 134/74    Wt Readings from Last 3 Encounters:  08/18/22 187 lb 12.8 oz (85.2 kg)  04/01/21 167 lb (75.8 kg)  02/11/21 223 lb 9.6 oz (101.4 kg)     Physical Exam Constitutional:      General: Bethany Singleton is not in acute distress.    Appearance: Bethany Singleton is well-developed.  HENT:     Head: Normocephalic and atraumatic.  Eyes:     Conjunctiva/sclera: Conjunctivae normal.     Pupils: Pupils are equal, round, and reactive to light.  Neck:     Thyroid: No thyromegaly.  Cardiovascular:     Rate and Rhythm: Normal rate and regular rhythm.     Heart sounds: Normal heart sounds. No murmur heard. Pulmonary:     Effort: Pulmonary effort is normal. No respiratory distress.     Breath sounds: Normal breath sounds. No wheezing or rales.  Abdominal:     General: Bowel sounds are normal. There is no distension.     Palpations: Abdomen is  soft.     Tenderness: There is no abdominal tenderness.  Musculoskeletal:        General: Normal range of motion.     Cervical back: Normal range of motion and neck supple.  Lymphadenopathy:     Cervical: No cervical adenopathy.  Skin:    General: Skin is warm and dry.  Neurological:     Mental Status: Bethany Singleton is alert and oriented to person, place, and time.  Psychiatric:        Behavior: Behavior normal.        Thought Content: Thought content normal.        Judgment: Judgment normal.       Assessment & Plan:   Bethany Singleton was seen today for medical management of chronic issues.  Diagnoses and all orders for this visit:  Chronic jaw pain -     Ambulatory referral to ENT  GAD (generalized anxiety disorder)  Other orders -     phentermine 37.5 MG capsule; Take 1 capsule (37.5 mg total) by mouth every morning.       I  have discontinued Karem H. Mcwherter's acetaminophen, ibuprofen, QUEtiapine, amoxicillin-clavulanate, trimethoprim-polymyxin b, and doxycycline. I am also having Bethany Singleton start on phentermine. Additionally, I am having Bethany Singleton maintain Bethany Singleton citalopram.  Allergies as of 08/18/2022       Reactions   Latex    Band aids primarily, when left on too long they remove skin        Medication List        Accurate as of August 18, 2022  5:10 PM. If you have any questions, ask your nurse or doctor.          STOP taking these medications    acetaminophen 500 MG tablet Commonly known as: TYLENOL Stopped by: Mechele Claude, MD   amoxicillin-clavulanate [id number] MG tablet Commonly known as: AUGMENTIN Stopped by: Mechele Claude, MD   doxycycline 100 MG capsule Commonly known as: MONODOX Stopped by: Mechele Claude, MD   ibuprofen 600 MG tablet Commonly known as: ADVIL Stopped by: Mechele Claude, MD   QUEtiapine 25 MG tablet Commonly known as: SEROQUEL Stopped by: Mechele Claude, MD   trimethoprim-polymyxin b ophthalmic solution Commonly known as: Polytrim Stopped by: Mechele Claude, MD       TAKE these medications    citalopram 20 MG tablet Commonly known as: CELEXA Take 3 tablets (60 mg total) by mouth daily.   phentermine 37.5 MG capsule Take 1 capsule (37.5 mg total) by mouth every morning. Started by: Mechele Claude, MD         Follow-up: Return in about 3 months (around 11/18/2022) for recheck weight loss.  Mechele Claude, M.D.

## 2022-08-18 NOTE — Patient Instructions (Signed)
Decrease the citalopram by 10 mg (1/2 tablet) monthly until completely stopped after 5 months

## 2022-08-31 ENCOUNTER — Encounter: Payer: Self-pay | Admitting: Family Medicine

## 2022-08-31 ENCOUNTER — Other Ambulatory Visit: Payer: Self-pay | Admitting: Family Medicine

## 2022-08-31 DIAGNOSIS — G8929 Other chronic pain: Secondary | ICD-10-CM

## 2022-10-05 ENCOUNTER — Telehealth: Payer: Self-pay | Admitting: Family Medicine

## 2022-10-05 DIAGNOSIS — F411 Generalized anxiety disorder: Secondary | ICD-10-CM

## 2022-10-05 MED ORDER — CITALOPRAM HYDROBROMIDE 20 MG PO TABS: 60.0000 mg | ORAL_TABLET | Freq: Every day | ORAL | 0 refills | Status: DC

## 2022-10-05 NOTE — Telephone Encounter (Signed)
Rx filled until upcoming apportionment . Patient aware

## 2022-10-05 NOTE — Telephone Encounter (Signed)
  Prescription Request  10/05/2022  Is this a "Controlled Substance" medicine? no  Have you seen your PCP in the last 2 weeks? Pt has appt on 11/18/22  If YES, route message to pool  -  If NO, patient needs to be scheduled for appointment.  What is the name of the medication or equipment? citalopram (CELEXA) 20 MG tablet  Pt ran out on Friday 10/02/22  Have you contacted your pharmacy to request a refill? yes   Which pharmacy would you like this sent to? [facility] on [address]   Patient notified that their request is being sent to the clinical staff for review and that they should receive a response within 2 business days.

## 2022-10-21 ENCOUNTER — Encounter: Payer: Self-pay | Admitting: Family Medicine

## 2022-10-21 DIAGNOSIS — F411 Generalized anxiety disorder: Secondary | ICD-10-CM

## 2022-10-21 MED ORDER — CITALOPRAM HYDROBROMIDE 40 MG PO TABS: 40.0000 mg | ORAL_TABLET | Freq: Every day | ORAL | 1 refills | Status: DC

## 2022-11-01 ENCOUNTER — Other Ambulatory Visit: Payer: Self-pay | Admitting: Family Medicine

## 2022-11-01 DIAGNOSIS — F411 Generalized anxiety disorder: Secondary | ICD-10-CM

## 2022-11-03 ENCOUNTER — Telehealth: Payer: 59 | Admitting: Physician Assistant

## 2022-11-03 DIAGNOSIS — J069 Acute upper respiratory infection, unspecified: Secondary | ICD-10-CM | POA: Diagnosis not present

## 2022-11-03 MED ORDER — ALBUTEROL SULFATE HFA 108 (90 BASE) MCG/ACT IN AERS: 2.0000 | INHALATION_SPRAY | Freq: Four times a day (QID) | RESPIRATORY_TRACT | 0 refills | Status: DC | PRN

## 2022-11-03 MED ORDER — BENZONATATE 100 MG PO CAPS: 100.0000 mg | ORAL_CAPSULE | Freq: Three times a day (TID) | ORAL | 0 refills | Status: DC | PRN

## 2022-11-03 NOTE — Progress Notes (Signed)
I have spent 5 minutes in review of e-visit questionnaire, review and updating patient chart, medical decision making and response to patient.   William Cody Martin, PA-C    

## 2022-11-03 NOTE — Progress Notes (Signed)

## 2022-11-18 ENCOUNTER — Ambulatory Visit: Payer: 59 | Admitting: Family Medicine

## 2022-12-22 ENCOUNTER — Ambulatory Visit: Payer: 59 | Admitting: Family Medicine

## 2023-05-28 ENCOUNTER — Other Ambulatory Visit: Payer: Self-pay | Admitting: Family Medicine

## 2023-06-07 ENCOUNTER — Encounter: Payer: Self-pay | Admitting: Family Medicine

## 2023-06-07 ENCOUNTER — Ambulatory Visit (INDEPENDENT_AMBULATORY_CARE_PROVIDER_SITE_OTHER): Admitting: Family Medicine

## 2023-06-07 DIAGNOSIS — F411 Generalized anxiety disorder: Secondary | ICD-10-CM

## 2023-06-07 MED ORDER — CITALOPRAM HYDROBROMIDE 20 MG PO TABS: 20.0000 mg | ORAL_TABLET | Freq: Every day | ORAL | 1 refills | Status: DC

## 2023-06-07 NOTE — Progress Notes (Signed)
 Subjective:  Patient ID: Bethany Singleton, female    DOB: 1990/10/05  Age: 33 y.o. MRN: [id number]  CC: Medication Refill (Depression and lack of interest. )   HPI Bethany Singleton presents for severe depression based on recently exhausting egg supply for IVF. Gaining weight. Can't walk down the hill to feed her pig due to dyspnea from being out of shape. We had reduced her citalopram in the late summer last year because ehe was doing well. Now taking 40 mg, but off of med during IVF process.     06/07/2023    1:01 PM 08/18/2022   11:18 AM 04/01/2021   11:25 AM  Depression screen PHQ 2/9  Decreased Interest 3 0 0  Down, Depressed, Hopeless 3 0 0  PHQ - 2 Score 6 0 0  Altered sleeping 3  0  Tired, decreased energy 3  0  Change in appetite 3  0  Feeling bad or failure about yourself  3  0  Trouble concentrating 3  0  Moving slowly or fidgety/restless 3  0  Suicidal thoughts 0  0  PHQ-9 Score 24  0  Difficult doing work/chores Extremely dIfficult  Not difficult at all    History Bethany Singleton has a past medical history of Anxiety and Tuberculosis.   Bethany Singleton has a past surgical history that includes lymph node removal from behind R ear; Colonoscopy with propofol (N/A, 12/13/2018); and Cesarean section (N/A, 02/11/2021).   Her family history is not on file.Bethany Singleton reports that Bethany Singleton has never smoked. Bethany Singleton has never used smokeless tobacco. Bethany Singleton reports current alcohol use. Bethany Singleton reports that Bethany Singleton does not use drugs.    ROS Review of Systems  Constitutional: Negative.   HENT: Negative.    Eyes:  Negative for visual disturbance.  Respiratory:  Negative for shortness of breath.   Cardiovascular:  Negative for chest pain.  Gastrointestinal:  Negative for abdominal pain.  Musculoskeletal:  Negative for arthralgias.    Objective:  BP 126/86   Pulse 84   Temp 98.3 F (36.8 C)   Ht 5\' 4"  (1.626 m)   Wt 183 lb (83 kg)   LMP 06/05/2023   SpO2 98%   BMI 31.41 kg/m   BP Readings from  Last 3 Encounters:  06/07/23 126/86  08/18/22 112/67  04/01/21 115/82    Wt Readings from Last 3 Encounters:  06/07/23 183 lb (83 kg)  08/18/22 187 lb 12.8 oz (85.2 kg)  04/01/21 167 lb (75.8 kg)     Physical Exam Constitutional:      General: Bethany Singleton is not in acute distress.    Appearance: Bethany Singleton is well-developed.  Cardiovascular:     Rate and Rhythm: Normal rate and regular rhythm.  Pulmonary:     Breath sounds: Normal breath sounds.  Musculoskeletal:        General: Normal range of motion.  Skin:    General: Skin is warm and dry.  Neurological:     Mental Status: Bethany Singleton is alert and oriented to person, place, and time.      Assessment & Plan:  GAD (generalized anxiety disorder)  Other orders -     Citalopram Hydrobromide; Take 1 tablet (20 mg total) by mouth daily.  Dispense: 90 tablet; Refill: 1  Patient and/or legal guardian verbally consented to [facility] services about presenting concerns and psychiatric consultation as appropriate.  The services will be billed as appropriate for the patient  Follow-up: Return in about 2 weeks (around 06/21/2023).  Pt. Interested in phentermine. Will consider at that time.  Mechele Claude, M.D.

## 2023-06-22 ENCOUNTER — Encounter: Payer: Self-pay | Admitting: Family Medicine

## 2023-06-22 ENCOUNTER — Telehealth (INDEPENDENT_AMBULATORY_CARE_PROVIDER_SITE_OTHER): Admitting: Family Medicine

## 2023-06-22 DIAGNOSIS — F321 Major depressive disorder, single episode, moderate: Secondary | ICD-10-CM

## 2023-06-22 DIAGNOSIS — F411 Generalized anxiety disorder: Secondary | ICD-10-CM | POA: Diagnosis not present

## 2023-06-22 MED ORDER — CITALOPRAM HYDROBROMIDE 20 MG PO TABS: 20.0000 mg | ORAL_TABLET | Freq: Three times a day (TID) | ORAL | 5 refills | Status: AC

## 2023-06-22 MED ORDER — PHENTERMINE HCL 37.5 MG PO CAPS: 37.5000 mg | ORAL_CAPSULE | ORAL | 2 refills | Status: AC

## 2023-06-22 NOTE — Progress Notes (Signed)
 Subjective:  Patient ID: Bethany Singleton, female    DOB: May 13, 1990  Age: 33 y.o. MRN: [id number]  CC: No chief complaint on file.   HPI [employer] presents for .  Because of depression.  She has been taking the citalopram  60 mg a day since she was last here.  She is feeling much better.  Below are the PHQ scores from before.  Review shows that her interest level in activities has improved she does not feel down and helpless nearly so much.  She is actually sleeping well and has more energy.  Her days of feeling better like a failure are minimal.  She also still interested in the phentermine  to help with weight loss.  She is working on a diet and exercise program to use along with it.     06/07/2023    1:01 PM 08/18/2022   11:18 AM 04/01/2021   11:25 AM  Depression screen PHQ 2/9  Decreased Interest 3 0 0  Down, Depressed, Hopeless 3 0 0  PHQ - 2 Score 6 0 0  Altered sleeping 3  0  Tired, decreased energy 3  0  Change in appetite 3  0  Feeling bad or failure about yourself  3  0  Trouble concentrating 3  0  Moving slowly or fidgety/restless 3  0  Suicidal thoughts 0  0  PHQ-9 Score 24  0  Difficult doing work/chores Extremely dIfficult  Not difficult at all    History Bethany Singleton has a past medical history of Anxiety and Tuberculosis.   She has a past surgical history that includes lymph node removal from behind R ear; Colonoscopy with propofol  (N/A, 12/13/2018); and Cesarean section (N/A, 02/11/2021).   Her family history is not on file.She reports that she has never smoked. She has never used smokeless tobacco. She reports current alcohol use. She reports that she does not use drugs.    ROS Review of Systems  Constitutional: Negative.   HENT: Negative.    Eyes:  Negative for visual disturbance.  Respiratory:  Negative for shortness of breath.   Cardiovascular:  Negative for chest pain.  Gastrointestinal:  Negative for abdominal pain.  Musculoskeletal:   Negative for arthralgias.    Objective:  LMP 06/05/2023   BP Readings from Last 3 Encounters:  06/07/23 126/86  08/18/22 112/67  04/01/21 115/82    Wt Readings from Last 3 Encounters:  06/07/23 183 lb (83 kg)  08/18/22 187 lb 12.8 oz (85.2 kg)  04/01/21 167 lb (75.8 kg)     Physical Exam Constitutional:      General: She is not in acute distress.    Appearance: She is well-developed.  Cardiovascular:     Rate and Rhythm: Normal rate and regular rhythm.  Pulmonary:     Breath sounds: Normal breath sounds.  Musculoskeletal:        General: Normal range of motion.  Skin:    General: Skin is warm and dry.  Neurological:     Mental Status: She is alert and oriented to person, place, and time.      Assessment & Plan:  GAD (generalized anxiety disorder)  Current moderate episode of major depressive disorder without prior episode (HCC)  Other orders -     Citalopram  Hydrobromide; Take 1 tablet (20 mg total) by mouth 3 (three) times daily.  Dispense: 90 tablet; Refill: 5 -     Phentermine  HCl; Take 1 capsule (37.5 mg total) by mouth every morning.  Dispense: 30 capsule; Refill: 2  Virtual Visit Note I discussed the limitations, risks, security and privacy concerns of performing an evaluation and management service by video and the availability of in person appointments. I also discussed with the patient that there may be a patient responsible charge related to this service. The patient expressed understanding and agreed to proceed. Pt. Is at home. Dr. Veleta Gerold is in his office.  Follow Up Instructions:   I discussed the assessment and treatment plan with the patient. The patient was provided an opportunity to ask questions and all were answered. The patient agreed with the plan and demonstrated an understanding of the instructions.   The patient was advised to call back or seek an in-person evaluation if the symptoms worsen or if the condition fails to improve as  anticipated.  Total minutes including chart review and phone contact time: 21    Follow-up: Return in about 3 months (around 09/21/2023), or if symptoms worsen or fail to improve.  Bethany Singleton, M.D.

## 2023-06-24 ENCOUNTER — Other Ambulatory Visit: Payer: Self-pay | Admitting: Family Medicine

## 2023-06-24 DIAGNOSIS — F411 Generalized anxiety disorder: Secondary | ICD-10-CM

## 2023-07-13 ENCOUNTER — Institutional Professional Consult (permissible substitution): Admitting: Professional Counselor

## 2023-09-12 ENCOUNTER — Emergency Department (HOSPITAL_COMMUNITY)

## 2023-09-12 ENCOUNTER — Encounter (HOSPITAL_COMMUNITY): Payer: Self-pay

## 2023-09-12 ENCOUNTER — Emergency Department (HOSPITAL_COMMUNITY)
Admission: EM | Admit: 2023-09-12 | Discharge: 2023-09-12 | Disposition: A | Discharge status: Home / Self Care | Attending: Emergency Medicine | Admitting: Emergency Medicine

## 2023-09-12 ENCOUNTER — Other Ambulatory Visit: Payer: Self-pay

## 2023-09-12 DIAGNOSIS — N132 Hydronephrosis with renal and ureteral calculous obstruction: Secondary | ICD-10-CM | POA: Diagnosis not present

## 2023-09-12 DIAGNOSIS — R1032 Left lower quadrant pain: Secondary | ICD-10-CM | POA: Diagnosis present

## 2023-09-12 DIAGNOSIS — N200 Calculus of kidney: Secondary | ICD-10-CM

## 2023-09-12 LAB — COMPREHENSIVE METABOLIC PANEL WITH GFR
ALT: 21 U/L (ref 0–44)
AST: 17 U/L (ref 15–41)
Albumin: 3.7 g/dL (ref 3.5–5.0)
Alkaline Phosphatase: 82 U/L (ref 38–126)
Anion gap: 8 (ref 5–15)
BUN: 11 mg/dL (ref 6–20)
CO2: 23 mmol/L (ref 22–32)
Calcium: 8.8 mg/dL — ABNORMAL LOW (ref 8.9–10.3)
Chloride: 100 mmol/L (ref 98–111)
Creatinine, Ser: 0.79 mg/dL (ref 0.44–1.00)
GFR, Estimated: >60 mL/min (ref >60)
Glucose, Bld: 97 mg/dL (ref 70–99)
Potassium: 3.9 mmol/L (ref 3.5–5.1)
Sodium: 131 mmol/L — ABNORMAL LOW (ref 135–145)
Total Bilirubin: 0.8 mg/dL (ref 0.0–1.2)
Total Protein: 6.9 g/dL (ref 6.5–8.1)

## 2023-09-12 LAB — CBC
HCT: 37.4 % (ref 36.0–46.0)
Hemoglobin: 12.9 g/dL (ref 12.0–15.0)
MCH: 30.6 pg (ref 26.0–34.0)
MCHC: 34.5 g/dL (ref 30.0–36.0)
MCV: 88.6 fL (ref 80.0–100.0)
Platelets: 189 K/uL (ref 150–400)
RBC: 4.22 MIL/uL (ref 3.87–5.11)
RDW: 12.3 % (ref 11.5–15.5)
WBC: 8.9 K/uL (ref 4.0–10.5)
nRBC: 0 % (ref 0.0–0.2)

## 2023-09-12 LAB — URINALYSIS, ROUTINE W REFLEX MICROSCOPIC
Bilirubin Urine: NEGATIVE
Glucose, UA: NEGATIVE mg/dL
Ketones, ur: NEGATIVE mg/dL
Leukocytes,Ua: NEGATIVE
Nitrite: NEGATIVE
Protein, ur: NEGATIVE mg/dL
RBC / HPF: >50 RBC/hpf (ref 0–5)
Specific Gravity, Urine: 1.025 (ref 1.005–1.030)
pH: 5 (ref 5.0–8.0)

## 2023-09-12 LAB — PREGNANCY, URINE: Preg Test, Ur: NEGATIVE

## 2023-09-12 MED ORDER — NAPROXEN 500 MG PO TABS: 500.0000 mg | ORAL_TABLET | Freq: Two times a day (BID) | ORAL | 0 refills | Status: AC

## 2023-09-12 MED ORDER — OXYCODONE HCL 5 MG PO TABS
5.0000 mg | ORAL_TABLET | Freq: Once | ORAL | Status: AC
  Administered 2023-09-12: 5 mg via ORAL
  Filled 2023-09-12: qty 1

## 2023-09-12 MED ORDER — KETOROLAC TROMETHAMINE 15 MG/ML IJ SOLN
30.0000 mg | Freq: Once | INTRAMUSCULAR | Status: AC
  Administered 2023-09-12: 30 mg via INTRAMUSCULAR
  Filled 2023-09-12: qty 2

## 2023-09-12 NOTE — ED Triage Notes (Signed)
 Pt c/o left sided flank pain and dysuria that started last night.

## 2023-09-12 NOTE — Discharge Instructions (Addendum)
 You were evaluated in the Emergency Department and after careful evaluation, we did not find any emergent condition requiring admission or further testing in the hospital.  Your exam/testing today is overall reassuring.  Symptoms likely due to a kidney stone.  Recommend follow-up with urology as we discussed.  Can use the Naprosyn  twice daily as needed for pain.  Please return to the Emergency Department if you experience any worsening of your condition.   Thank you for allowing us  to be a part of your care.

## 2023-09-12 NOTE — ED Provider Notes (Signed)
 [facility]-EMERGENCY DEPT [facility] Emergency Department Provider Note MRN:  [id number]  Arrival date & time: 09/12/23     Chief Complaint   Dysuria and Flank Pain   History of Present Illness   Bethany Singleton is a 33 y.o. year-old female with no pertinent past medical history presenting to the ED with chief complaint of dysuria and flank pain.  Dysuria this started yesterday evening.  Woke up suddenly with left lower quadrant flank pain tonight.  No nausea or vomiting.  No vaginal bleeding, no blood in urine, no diarrhea constipation.  Review of Systems  A thorough review of systems was obtained and all systems are negative except as noted in the HPI and PMH.   Patient's Singleton History    Past Medical History:  Diagnosis Date   Anxiety    Tuberculosis     Past Surgical History:  Procedure Laterality Date   CESAREAN SECTION N/A 02/11/2021   Procedure: CESAREAN SECTION;  Surgeon: Bethany Ade, MD;  Location: [facility] LD ORS;  Service: Obstetrics;  Laterality: N/A;   COLONOSCOPY WITH PROPOFOL  N/A 12/13/2018   Procedure: COLONOSCOPY WITH PROPOFOL ;  Surgeon: Bethany Margo CROME, MD;  Location: [facility] ENDO SUITE;  Service: Endoscopy;  Laterality: N/A;  12:00pm   lymph node removal from behind R ear      Family History  Problem Relation Age of Onset   Colon cancer Neg Hx    Colon polyps Neg Hx    Inflammatory bowel disease Neg Hx     Social History   Socioeconomic History   Marital status: Married    Spouse name: Not on file   Number of children: Not on file   Years of education: Not on file   Highest education level: Not on file  Occupational History   Not on file  Tobacco Use   Smoking status: Never   Smokeless tobacco: Never  Vaping Use   Vaping status: Never Used  Substance and Sexual Activity   Alcohol use: Yes    Comment: weekends- glass or two of wine   Drug use: No   Sexual activity: Yes    Birth control/protection: Injection  Other Topics Concern   Not on  file  Social History Narrative   Not on file   Social Drivers of Singleton   Financial Resource Strain: Not on file  Food Insecurity: Low Risk  (10/26/2022)   Received from [facility]   Hunger Vital Sign    Within the past 12 months, you worried that your food would run out before you got money to buy more: Never true    Within the past 12 months, the food you bought just didn't last and you didn't have money to get more. : Never true  Transportation Needs: No Transportation Needs (10/26/2022)   Received from [facility]    In the past 12 months, has lack of reliable transportation kept you from medical appointments, meetings, work or from getting things needed for daily living? : No  Physical Activity: Not on file  Stress: Not on file  Social Connections: Not on file  Intimate Partner Violence: Not on file     Physical Exam   Vitals:   09/12/23 0430 09/12/23 0500  BP: 122/77 123/86  Pulse: 84 89  Resp: 18 17  Temp:    SpO2: 97% 96%    CONSTITUTIONAL: Well-appearing, NAD NEURO/PSYCH:  Alert and oriented x 3, no focal deficits EYES:  eyes equal and reactive ENT/NECK:  no LAD, no JVD CARDIO: Regular rate, well-perfused, normal S1 and S2 PULM:  CTAB no wheezing or rhonchi GI/GU:  non-distended, non-tender MSK/SPINE:  No gross deformities, no edema SKIN:  no rash, atraumatic   *Additional and/or pertinent findings included in MDM below  Diagnostic and Interventional Summary    EKG Interpretation Date/Time:    Ventricular Rate:    PR Interval:    QRS Duration:    QT Interval:    QTC Calculation:   R Axis:      Text Interpretation:         Labs Reviewed  URINALYSIS, ROUTINE W REFLEX MICROSCOPIC - Abnormal; Notable for the following components:      Result Value   Hgb urine dipstick MODERATE (*)    Bacteria, UA RARE (*)    All other components within normal limits  COMPREHENSIVE METABOLIC PANEL WITH GFR - Abnormal; Notable for the following  components:   Sodium 131 (*)    Calcium 8.8 (*)    All other components within normal limits  PREGNANCY, URINE  CBC    CT RENAL STONE STUDY  Final Result      Medications  ketorolac  (TORADOL ) 15 MG/ML injection 30 mg (30 mg Intramuscular Given 09/12/23 0400)  oxyCODONE  (Oxy IR/ROXICODONE ) immediate release tablet 5 mg (5 mg Oral Given 09/12/23 0359)     Procedures  /  Critical Care Procedures  ED Course and Medical Decision Making  Initial Impression and Ddx Differential diagnosis includes UTI, kidney stone, pyelonephritis, ovarian cyst, diverticulitis.  Past medical/surgical history that increases complexity of ED encounter: None  Interpretation of Diagnostics I personally reviewed the Laboratory Testing and my interpretation is as follows: No significant blood count or electrolyte disturbance.  Urinalysis with blood  CT confirms stone that has passed into the bladder  Patient Reassessment and Ultimate Disposition/Management     Patient is comfortable on reassessment, appropriate for discharge.  Patient management required discussion with the following services or consulting groups:  None  Complexity of Problems Addressed Acute illness or injury that poses threat of life of bodily function  Additional Data Reviewed and Analyzed Further history obtained from: Further history from spouse/family member  Additional Factors Impacting ED Encounter Risk Consideration of hospitalization  Bethany HERO. [place], MD [facility] Bethany Singleton Bethany Singleton  Final Clinical Impressions(s) / ED Diagnoses     ICD-10-CM   1. Kidney stone  N20.0       ED Discharge Orders          Ordered    naproxen  (NAPROSYN ) 500 MG tablet  2 times daily        09/12/23 0553             Discharge Instructions Discussed with and Provided to Patient:    Discharge Instructions      You were evaluated in the Emergency Department and after careful  evaluation, we did not find any emergent condition requiring admission or further testing in the hospital.  Your exam/testing today is overall reassuring.  Symptoms likely due to a kidney stone.  Recommend follow-up with urology as we discussed.  Can use the Naprosyn  twice daily as needed for pain.  Please return to the Emergency Department if you experience any worsening of your condition.   Thank you for allowing us  to be a part of your care.      [facility] Bethany HERO, MD 09/12/23 [phone]

## 2023-09-12 NOTE — ED Notes (Signed)
 Patient transported to CT

## 2024-01-05 ENCOUNTER — Other Ambulatory Visit: Payer: Self-pay | Admitting: Family Medicine
